# Patient Record
Sex: Female | Born: 1950 | Race: White | Hispanic: No | Marital: Married | Smoking: Never smoker
Health system: Southern US, Community
[De-identification: ages and names within clinical notes are randomized; demographics above are authoritative.]

## PROBLEM LIST (undated history)

## (undated) DIAGNOSIS — T7840XA Allergy, unspecified, initial encounter: Secondary | ICD-10-CM

## (undated) DIAGNOSIS — E079 Disorder of thyroid, unspecified: Secondary | ICD-10-CM

## (undated) DIAGNOSIS — IMO0002 Reserved for concepts with insufficient information to code with codable children: Secondary | ICD-10-CM

## (undated) DIAGNOSIS — M8580 Other specified disorders of bone density and structure, unspecified site: Secondary | ICD-10-CM

## (undated) DIAGNOSIS — D229 Melanocytic nevi, unspecified: Secondary | ICD-10-CM

## (undated) DIAGNOSIS — L409 Psoriasis, unspecified: Secondary | ICD-10-CM

## (undated) DIAGNOSIS — M1990 Unspecified osteoarthritis, unspecified site: Secondary | ICD-10-CM

## (undated) HISTORY — PX: SKIN SURGERY: SHX2413

## (undated) HISTORY — DX: Other specified disorders of bone density and structure, unspecified site: M85.80

## (undated) HISTORY — DX: Psoriasis, unspecified: L40.9

## (undated) HISTORY — DX: Allergy, unspecified, initial encounter: T78.40XA

## (undated) HISTORY — DX: Disorder of thyroid, unspecified: E07.9

## (undated) HISTORY — DX: Unspecified osteoarthritis, unspecified site: M19.90

## (undated) HISTORY — DX: Reserved for concepts with insufficient information to code with codable children: IMO0002

## (undated) HISTORY — DX: Melanocytic nevi, unspecified: D22.9

---
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
## 1975-07-08 HISTORY — PX: OTHER SURGICAL HISTORY: SHX169

## 1985-07-07 HISTORY — PX: OTHER SURGICAL HISTORY: SHX169

## 2000-05-14 ENCOUNTER — Other Ambulatory Visit: Admission: RE | Admit: 2000-05-14 | Discharge: 2000-05-14 | Payer: Self-pay | Admitting: Obstetrics and Gynecology

## 2000-06-05 ENCOUNTER — Ambulatory Visit (HOSPITAL_COMMUNITY): Admission: RE | Admit: 2000-06-05 | Discharge: 2000-06-05 | Payer: Self-pay | Admitting: Obstetrics and Gynecology

## 2000-06-05 ENCOUNTER — Encounter: Payer: Self-pay | Admitting: Obstetrics and Gynecology

## 2000-07-07 HISTORY — PX: LUNG LOBECTOMY: SHX167

## 2001-06-21 ENCOUNTER — Encounter: Payer: Self-pay | Admitting: Thoracic Surgery

## 2001-06-21 ENCOUNTER — Inpatient Hospital Stay (HOSPITAL_COMMUNITY): Admission: RE | Admit: 2001-06-21 | Discharge: 2001-06-24 | Payer: Self-pay | Admitting: Thoracic Surgery

## 2001-06-22 ENCOUNTER — Encounter: Payer: Self-pay | Admitting: Thoracic Surgery

## 2001-06-23 ENCOUNTER — Encounter: Payer: Self-pay | Admitting: Thoracic Surgery

## 2001-06-24 ENCOUNTER — Encounter: Payer: Self-pay | Admitting: Thoracic Surgery

## 2001-06-29 ENCOUNTER — Encounter: Admission: RE | Admit: 2001-06-29 | Discharge: 2001-06-29 | Payer: Self-pay | Admitting: Thoracic Surgery

## 2001-06-29 ENCOUNTER — Encounter: Payer: Self-pay | Admitting: Thoracic Surgery

## 2001-07-28 ENCOUNTER — Encounter: Admission: RE | Admit: 2001-07-28 | Discharge: 2001-07-28 | Payer: Self-pay | Admitting: Thoracic Surgery

## 2001-07-28 ENCOUNTER — Encounter: Payer: Self-pay | Admitting: Thoracic Surgery

## 2001-08-24 ENCOUNTER — Other Ambulatory Visit: Admission: RE | Admit: 2001-08-24 | Discharge: 2001-08-24 | Payer: Self-pay | Admitting: Obstetrics and Gynecology

## 2001-09-29 ENCOUNTER — Encounter: Payer: Self-pay | Admitting: Thoracic Surgery

## 2001-09-29 ENCOUNTER — Encounter: Admission: RE | Admit: 2001-09-29 | Discharge: 2001-09-29 | Payer: Self-pay | Admitting: Thoracic Surgery

## 2002-05-24 ENCOUNTER — Ambulatory Visit (HOSPITAL_COMMUNITY): Admission: RE | Admit: 2002-05-24 | Discharge: 2002-05-24 | Payer: Self-pay | Admitting: Obstetrics and Gynecology

## 2002-05-24 ENCOUNTER — Encounter: Payer: Self-pay | Admitting: Obstetrics and Gynecology

## 2002-07-07 DIAGNOSIS — E079 Disorder of thyroid, unspecified: Secondary | ICD-10-CM

## 2002-07-07 HISTORY — DX: Disorder of thyroid, unspecified: E07.9

## 2002-08-25 ENCOUNTER — Other Ambulatory Visit: Admission: RE | Admit: 2002-08-25 | Discharge: 2002-08-25 | Payer: Self-pay | Admitting: Obstetrics and Gynecology

## 2002-08-26 ENCOUNTER — Encounter: Payer: Self-pay | Admitting: Obstetrics and Gynecology

## 2002-08-26 ENCOUNTER — Encounter: Admission: RE | Admit: 2002-08-26 | Discharge: 2002-08-26 | Payer: Self-pay | Admitting: Obstetrics and Gynecology

## 2005-12-30 ENCOUNTER — Ambulatory Visit: Payer: Self-pay | Admitting: Internal Medicine

## 2006-02-04 ENCOUNTER — Ambulatory Visit: Payer: Self-pay | Admitting: Internal Medicine

## 2006-03-27 ENCOUNTER — Ambulatory Visit (HOSPITAL_COMMUNITY): Admission: RE | Admit: 2006-03-27 | Discharge: 2006-03-27 | Payer: Self-pay | Admitting: Obstetrics and Gynecology

## 2009-07-07 DIAGNOSIS — M8580 Other specified disorders of bone density and structure, unspecified site: Secondary | ICD-10-CM

## 2009-07-07 HISTORY — DX: Other specified disorders of bone density and structure, unspecified site: M85.80

## 2010-07-27 ENCOUNTER — Encounter: Payer: Self-pay | Admitting: Internal Medicine

## 2011-08-05 ENCOUNTER — Other Ambulatory Visit: Payer: Self-pay | Admitting: Physician Assistant

## 2011-08-05 MED ORDER — LEVOTHYROXINE SODIUM 50 MCG PO TABS: 50.0000 ug | ORAL_TABLET | Freq: Every day | ORAL | Status: DC

## 2011-11-06 ENCOUNTER — Ambulatory Visit

## 2012-03-04 ENCOUNTER — Ambulatory Visit

## 2012-03-04 ENCOUNTER — Ambulatory Visit (INDEPENDENT_AMBULATORY_CARE_PROVIDER_SITE_OTHER): Admitting: Internal Medicine

## 2012-03-04 VITALS — BP 112/70 | HR 72 | Temp 97.7°F | Resp 16 | Ht 62.5 in | Wt 121.0 lb

## 2012-03-04 DIAGNOSIS — M542 Cervicalgia: Secondary | ICD-10-CM

## 2012-03-04 DIAGNOSIS — R202 Paresthesia of skin: Secondary | ICD-10-CM

## 2012-03-04 DIAGNOSIS — R209 Unspecified disturbances of skin sensation: Secondary | ICD-10-CM

## 2012-03-04 MED ORDER — HYDROCODONE-ACETAMINOPHEN 5-500 MG PO TABS: 1.0000 | ORAL_TABLET | Freq: Three times a day (TID) | ORAL | Status: AC | PRN

## 2012-03-04 MED ORDER — METHOCARBAMOL 750 MG PO TABS: 750.0000 mg | ORAL_TABLET | Freq: Four times a day (QID) | ORAL | Status: AC

## 2012-03-04 MED ORDER — METHYLPREDNISOLONE ACETATE 80 MG/ML IJ SUSP
80.0000 mg | Freq: Once | INTRAMUSCULAR | Status: AC
  Administered 2012-03-04: 80 mg via INTRAMUSCULAR

## 2012-03-04 NOTE — Progress Notes (Signed)
  Subjective:    Patient ID: Madison Yang, female    DOB: [birth year], 61 y.o.   MRN: [id number]  HPI Has recurrent neck pain on right side, now radiating into right arm and hand. Occ numbness, no weakness noted. No recent trauma, xr 3 yrs ago.   Review of Systems healthy    Objective:   Physical Exam  Constitutional: She is oriented to person, place, and time. She appears well-developed and well-nourished.  HENT:  Right Ear: External ear normal.  Left Ear: External ear normal.  Nose: Nose normal.  Eyes: EOM are normal. Pupils are equal, round, and reactive to light.  Cardiovascular: Normal rate.   Pulmonary/Chest: Effort normal.  Neurological: She is alert and oriented to person, place, and time.  Skin: Skin is warm and dry.  Psychiatric: She has a normal mood and affect.      [facility] reading (PRIMARY) by  Dr.Guest. cspine ddd,spondylosis      Assessment & Plan:  Robaxin/vicodin Neck care manual

## 2012-03-05 ENCOUNTER — Encounter: Payer: Self-pay | Admitting: Family Medicine

## 2012-03-18 ENCOUNTER — Encounter: Admitting: Physician Assistant

## 2012-05-18 ENCOUNTER — Encounter: Admitting: Family Medicine

## 2012-07-21 ENCOUNTER — Ambulatory Visit (INDEPENDENT_AMBULATORY_CARE_PROVIDER_SITE_OTHER): Admitting: Family Medicine

## 2012-07-21 ENCOUNTER — Other Ambulatory Visit: Payer: Self-pay | Admitting: Family Medicine

## 2012-07-21 ENCOUNTER — Encounter: Payer: Self-pay | Admitting: Family Medicine

## 2012-07-21 VITALS — BP 128/87 | HR 74 | Temp 98.0°F | Resp 16 | Ht 63.0 in | Wt 120.0 lb

## 2012-07-21 DIAGNOSIS — Z01419 Encounter for gynecological examination (general) (routine) without abnormal findings: Secondary | ICD-10-CM

## 2012-07-21 DIAGNOSIS — Z0000 Encounter for general adult medical examination without abnormal findings: Secondary | ICD-10-CM

## 2012-07-21 DIAGNOSIS — Z1211 Encounter for screening for malignant neoplasm of colon: Secondary | ICD-10-CM

## 2012-07-21 LAB — POCT UA - MICROSCOPIC ONLY: Crystals, Ur, HPF, POC: NEGATIVE

## 2012-07-21 LAB — COMPREHENSIVE METABOLIC PANEL
ALT: 21 U/L (ref 0–35)
AST: 22 U/L (ref 0–37)
Albumin: 5 g/dL (ref 3.5–5.2)
Alkaline Phosphatase: 43 U/L (ref 39–117)
Potassium: 3.8 mEq/L (ref 3.5–5.3)
Sodium: 137 mEq/L (ref 135–145)
Total Bilirubin: 0.6 mg/dL (ref 0.3–1.2)
Total Protein: 6.6 g/dL (ref 6.0–8.3)

## 2012-07-21 LAB — CBC WITH DIFFERENTIAL/PLATELET
Basophils Absolute: 0.1 10*3/uL (ref 0.0–0.1)
Basophils Relative: 2 % — ABNORMAL HIGH (ref 0–1)
Eosinophils Absolute: 0.1 10*3/uL (ref 0.0–0.7)
Hemoglobin: 15.3 g/dL — ABNORMAL HIGH (ref 12.0–15.0)
MCH: 31.1 pg (ref 26.0–34.0)
MCHC: 35.2 g/dL (ref 30.0–36.0)
Monocytes Relative: 8 % (ref 3–12)
Neutro Abs: 1.9 10*3/uL (ref 1.7–7.7)
Neutrophils Relative %: 64 % (ref 43–77)
Platelets: 290 10*3/uL (ref 150–400)
RBC: 4.92 MIL/uL (ref 3.87–5.11)

## 2012-07-21 LAB — POCT URINALYSIS DIPSTICK
Bilirubin, UA: NEGATIVE
Glucose, UA: NEGATIVE
Ketones, UA: NEGATIVE
Leukocytes, UA: NEGATIVE
Protein, UA: NEGATIVE
Spec Grav, UA: 1.01

## 2012-07-21 LAB — LIPID PANEL
HDL: 92 mg/dL (ref >39)
LDL Cholesterol: 167 mg/dL — ABNORMAL HIGH (ref 0–99)
Total CHOL/HDL Ratio: 3 Ratio
VLDL: 15 mg/dL (ref 0–40)

## 2012-07-21 LAB — VITAMIN B12: Vitamin B-12: 834 pg/mL (ref 211–911)

## 2012-07-21 LAB — TSH: TSH: 2.096 u[IU]/mL (ref 0.350–4.500)

## 2012-07-21 NOTE — Progress Notes (Signed)
[address]   [place], [place]  [address]   [phone]  Subjective:    Patient ID: Madison Yang, female    DOB: 17-Jul-1950, 62 y.o.   MRN: [id number]  HPIThis 62 y.o. female presents for evaluation for CPE.   Last physical 12/2010. Pap smear 12/2010 WNL; no HPV.  Pap smear 07/2008 WNL; no HPV obtained. Mammogram 05/2012 at [facility].   Colonoscopy 2004; [facility].   TDAP 07/2008. Zostavax 05/2012. Flu vaccine 2013.   Eye exam 2013; +glasses; no glaucoma or cataracts. Dental exam temporary crown 2014. Bone density scan unknown.    Other providers:  [facility]/GI; Endocrin[facility]; [facility]/Dermatology; Gyn/none.     Review of Systems  Constitutional: Negative for fever, chills, diaphoresis, activity change, appetite change, fatigue and unexpected weight change.  HENT: Negative for hearing loss, ear pain, nosebleeds, congestion, sore throat, facial swelling, rhinorrhea, sneezing, drooling, mouth sores, trouble swallowing, neck pain, neck stiffness, dental problem, voice change, postnasal drip, sinus pressure, tinnitus and ear discharge.   Eyes: Negative for photophobia, pain, discharge, redness, itching and visual disturbance.  Respiratory: Negative for apnea, cough, choking, chest tightness, shortness of breath, wheezing and stridor.   Cardiovascular: Negative for chest pain, palpitations and leg swelling.  Gastrointestinal: Negative for nausea, vomiting, abdominal pain, diarrhea, constipation, blood in stool, abdominal distention, anal bleeding and rectal pain.  Genitourinary: Negative for dysuria, urgency, frequency, hematuria, flank pain, decreased urine volume, vaginal bleeding, vaginal discharge, enuresis, difficulty urinating, genital sores, vaginal pain, menstrual problem, pelvic pain and dyspareunia.  Musculoskeletal: Negative for myalgias, back pain, joint swelling, arthralgias and gait problem.  Skin: Negative for color change, pallor, rash and wound.  Neurological: Negative for  dizziness, tremors, seizures, syncope, facial asymmetry, speech difficulty, weakness, light-headedness, numbness and headaches.  Hematological: Negative for adenopathy. Does not bruise/bleed easily.  Psychiatric/Behavioral: Positive for sleep disturbance. Negative for suicidal ideas, hallucinations, behavioral problems, confusion, self-injury, dysphoric mood, decreased concentration and agitation. The patient is not nervous/anxious and is not hyperactive.     Past Medical History  Diagnosis Date  . Allergy   . Anemia   . Thyroid disease 07/07/2002    Hypothyroidism  . Arthritis     Cervical DDD C4, C7; L foot    Past Surgical History  Procedure Date  . Ovarian cyst removed 1977  . Ovarian scar tissue removal 1987  . Tonsilectomy, adenoidectomy, bilateral myringotomy and tubes 1959  . Lung lobectomy   . Colonoscopy 09/05/2002    normal; [facility]    Prior to Admission medications   Medication Sig Start Date End Date Taking? Authorizing Provider  ascorbic acid (VITAMIN C) 500 MG tablet Take 500 mg by mouth daily.   Yes Historical Provider, MD  calcium citrate-vitamin D 200-200 MG-UNIT TABS Take 1 tablet by mouth daily.   Yes Historical Provider, MD  Emollient (ROC RETINOL CORREXION NIGHT) CREA Apply 1 application topically at bedtime.   Yes Historical Provider, MD  levothyroxine (SYNTHROID, LEVOTHROID) 50 MCG tablet Take 1 tablet (50 mcg total) by mouth daily. 08/05/11 08/04/12 Yes Chelle S Jeffery, PA-C  vitamin E 400 UNIT capsule Take 400 Units by mouth daily.   Yes Historical Provider, MD  Wheat Dextrin (BENEFIBER PO) Take by mouth.   Yes Historical Provider, MD    Allergies  Allergen Reactions  . Penicillins Other (See Comments)    child    History   Social History  . Marital Status: Single    Spouse Name: N/A    Number of Children: N/A  .  Years of Education: N/A   Occupational History  . Not on file.   Social History Main Topics  . Smoking status: Never Smoker   .  Smokeless tobacco: Never Used  . Alcohol Use: 8.4 oz/week    14 Glasses of wine per week  . Drug Use: No  . Sexually Active: Yes    Birth Control/ Protection: None   Other Topics Concern  . Not on file   Social History Narrative   Marital status: married x 20 years; happily married; no abuse; widowed 1988.   Children: stepchildren 3; 6 grandchildren; 1 great grandchildren.   Lives: with husband.   Employment:  Unemployed.   Tobacco: none    Alcohol:  1-2 glasses of wine five days per week.    Drugs: none   Exercise:  Walking 2-3 days per week; swimming.    Family History  Problem Relation Age of Onset  . Hypertension Mother   . Hyperlipidemia Mother   . Dementia Mother   . Hyperlipidemia Father   . Hypertension Father        Objective:   Physical Exam  Nursing note and vitals reviewed. Constitutional: She is oriented to person, place, and time. She appears well-developed and well-nourished. No distress.  HENT:  Head: Normocephalic and atraumatic.  Right Ear: External ear normal.  Left Ear: External ear normal.  Nose: Nose normal.  Mouth/Throat: Oropharynx is clear and moist.  Eyes: Conjunctivae normal and EOM are normal. Pupils are equal, round, and reactive to light.  Neck: Normal range of motion. Neck supple. No JVD present. No thyromegaly present.  Cardiovascular: Normal rate, regular rhythm, normal heart sounds and intact distal pulses.  Exam reveals no gallop and no friction rub.   No murmur heard. Pulmonary/Chest: Effort normal and breath sounds normal. She has no wheezes. She has no rales.  Abdominal: Soft. Bowel sounds are normal. There is no tenderness. There is no rebound and no guarding. Hernia confirmed negative in the right inguinal area and confirmed negative in the left inguinal area.  Genitourinary: Vagina normal and uterus normal. No breast swelling, tenderness, discharge or bleeding. There is no rash, tenderness or lesion on the right labia. There is no rash,  tenderness or lesion on the left labia. No vaginal discharge found.  Musculoskeletal:       Right shoulder: Normal.       Left shoulder: Normal.       Cervical back: Normal.  Lymphadenopathy:    She has no cervical adenopathy.       Right: No inguinal adenopathy present.       Left: No inguinal adenopathy present.  Neurological: She is alert and oriented to person, place, and time. She has normal reflexes. No cranial nerve deficit. She exhibits normal muscle tone. Coordination normal.  Skin: Skin is warm and dry. No rash noted. She is not diaphoretic.       Diffuse sun related changes throughout facial.  Psychiatric: She has a normal mood and affect. Her behavior is normal. Judgment and thought content normal.   EKG:        Assessment & Plan:   1. Routine general medical examination at a health care facility  Pap IG and HPV (high risk) DNA detection  2. Routine gynecological examination  CBC with Differential, Comprehensive metabolic panel, Hemoglobin A1c, Lipid panel, TSH, Vitamin B12, Vitamin D 25 hydroxy, EKG 12-Lead, POCT UA - Microscopic Only  3. Colon cancer screening  Ambulatory referral to Gastroenterology  1.  CPE:  Anticipatory guidance provided -- exercise, Calcium supplementation daily.  Pap smear obtained; mammogram UTD.  Refer for repeat colonoscopy. Immunizations UTD.  Obtain labs. 2.  Gynecological exam:  Pap smear with HPV obtained; mammogram UTD.  Completed in office. 3.  Colon cancer screening: refer to Dr. Juanda Chance for repeat colonoscopy.

## 2012-07-21 NOTE — Patient Instructions (Addendum)
1. Routine general medical examination at a health care facility  Pap IG and HPV (high risk) DNA detection  2. Routine gynecological examination  CBC with Differential, Comprehensive metabolic panel, Hemoglobin A1c, Lipid panel, TSH, Vitamin B12, Vitamin D 25 hydroxy, EKG 12-Lead, POCT UA - Microscopic Only  3. Colon cancer screening  Ambulatory referral to Gastroenterology   Health Maintenance, Females A healthy lifestyle and preventative care can promote health and wellness.  Maintain regular health, dental, and eye exams.  Eat a healthy diet. Foods like vegetables, fruits, whole grains, low-fat dairy products, and lean protein foods contain the nutrients you need without too many calories. Decrease your intake of foods high in solid fats, added sugars, and salt. Get information about a proper diet from your caregiver, if necessary.  Regular physical exercise is one of the most important things you can do for your health. Most adults should get at least 150 minutes of moderate-intensity exercise (any activity that increases your heart rate and causes you to sweat) each week. In addition, most adults need muscle-strengthening exercises on 2 or more days a week.   Maintain a healthy weight. The body mass index (BMI) is a screening tool to identify possible weight problems. It provides an estimate of body fat based on height and weight. Your caregiver can help determine your BMI, and can help you achieve or maintain a healthy weight. For adults 20 years and older:  A BMI below 18.5 is considered underweight.  A BMI of 18.5 to 24.9 is normal.  A BMI of 25 to 29.9 is considered overweight.  A BMI of 30 and above is considered obese.  Maintain normal blood lipids and cholesterol by exercising and minimizing your intake of saturated fat. Eat a balanced diet with plenty of fruits and vegetables. Blood tests for lipids and cholesterol should begin at age 86 and be repeated every 5 years. If your  lipid or cholesterol levels are high, you are over 50, or you are a high risk for heart disease, you may need your cholesterol levels checked more frequently.Ongoing high lipid and cholesterol levels should be treated with medicines if diet and exercise are not effective.  If you smoke, find out from your caregiver how to quit. If you do not use tobacco, do not start.  If you are pregnant, do not drink alcohol. If you are breastfeeding, be very cautious about drinking alcohol. If you are not pregnant and choose to drink alcohol, do not exceed 1 drink per day. One drink is considered to be 12 ounces (355 mL) of beer, 5 ounces (148 mL) of wine, or 1.5 ounces (44 mL) of liquor.  Avoid use of street drugs. Do not share needles with anyone. Ask for help if you need support or instructions about stopping the use of drugs.  High blood pressure causes heart disease and increases the risk of stroke. Blood pressure should be checked at least every 1 to 2 years. Ongoing high blood pressure should be treated with medicines, if weight loss and exercise are not effective.  If you are 53 to 61 years old, ask your caregiver if you should take aspirin to prevent strokes.  Diabetes screening involves taking a blood sample to check your fasting blood sugar level. This should be done once every 3 years, after age 63, if you are within normal weight and without risk factors for diabetes. Testing should be considered at a younger age or be carried out more frequently if you are overweight  and have at least 1 risk factor for diabetes.  Breast cancer screening is essential preventative care for women. You should practice "breast self-awareness." This means understanding the normal appearance and feel of your breasts and may include breast self-examination. Any changes detected, no matter how small, should be reported to a caregiver. Women in their 22s and 30s should have a clinical breast exam (CBE) by a caregiver as part of  a regular health exam every 1 to 3 years. After age 58, women should have a CBE every year. Starting at age 58, women should consider having a mammogram (breast X-ray) every year. Women who have a family history of breast cancer should talk to their caregiver about genetic screening. Women at a high risk of breast cancer should talk to their caregiver about having an MRI and a mammogram every year.  The Pap test is a screening test for cervical cancer. Women should have a Pap test starting at age 52. Between ages 42 and 45, Pap tests should be repeated every 2 years. Beginning at age 2, you should have a Pap test every 3 years as long as the past 3 Pap tests have been normal. If you had a hysterectomy for a problem that was not cancer or a condition that could lead to cancer, then you no longer need Pap tests. If you are between ages 59 and 19, and you have had normal Pap tests going back 10 years, you no longer need Pap tests. If you have had past treatment for cervical cancer or a condition that could lead to cancer, you need Pap tests and screening for cancer for at least 20 years after your treatment. If Pap tests have been discontinued, risk factors (such as a new sexual partner) need to be reassessed to determine if screening should be resumed. Some women have medical problems that increase the chance of getting cervical cancer. In these cases, your caregiver may recommend more frequent screening and Pap tests.  The human papillomavirus (HPV) test is an additional test that may be used for cervical cancer screening. The HPV test looks for the virus that can cause the cell changes on the cervix. The cells collected during the Pap test can be tested for HPV. The HPV test could be used to screen women aged 51 years and older, and should be used in women of any age who have unclear Pap test results. After the age of 63, women should have HPV testing at the same frequency as a Pap test.  Colorectal cancer  can be detected and often prevented. Most routine colorectal cancer screening begins at the age of 25 and continues through age 78. However, your caregiver may recommend screening at an earlier age if you have risk factors for colon cancer. On a yearly basis, your caregiver may provide home test kits to check for hidden blood in the stool. Use of a small camera at the end of a tube, to directly examine the colon (sigmoidoscopy or colonoscopy), can detect the earliest forms of colorectal cancer. Talk to your caregiver about this at age 67, when routine screening begins. Direct examination of the colon should be repeated every 5 to 10 years through age 52, unless early forms of pre-cancerous polyps or small growths are found.  Hepatitis C blood testing is recommended for all people born from [birth year] through 1964 and any individual with known risks for hepatitis C.  Practice safe sex. Use condoms and avoid high-risk sexual practices to reduce the  spread of sexually transmitted infections (STIs). Sexually active women aged 63 and younger should be checked for Chlamydia, which is a common sexually transmitted infection. Older women with new or multiple partners should also be tested for Chlamydia. Testing for other STIs is recommended if you are sexually active and at increased risk.  Osteoporosis is a disease in which the bones lose minerals and strength with aging. This can result in serious bone fractures. The risk of osteoporosis can be identified using a bone density scan. Women ages 14 and over and women at risk for fractures or osteoporosis should discuss screening with their caregivers. Ask your caregiver whether you should be taking a calcium supplement or vitamin D to reduce the rate of osteoporosis.  Menopause can be associated with physical symptoms and risks. Hormone replacement therapy is available to decrease symptoms and risks. You should talk to your caregiver about whether hormone replacement  therapy is right for you.  Use sunscreen with a sun protection factor (SPF) of 30 or greater. Apply sunscreen liberally and repeatedly throughout the day. You should seek shade when your shadow is shorter than you. Protect yourself by wearing long sleeves, pants, a wide-brimmed hat, and sunglasses year round, whenever you are outdoors.  Notify your caregiver of new moles or changes in moles, especially if there is a change in shape or color. Also notify your caregiver if a mole is larger than the size of a pencil eraser.  Stay current with your immunizations. Document Released: 01/06/2011 Document Revised: 09/15/2011 Document Reviewed: 01/06/2011 [facility] Patient Information 2013 [place], [place].

## 2012-07-22 ENCOUNTER — Encounter: Payer: Self-pay | Admitting: Internal Medicine

## 2012-07-22 ENCOUNTER — Encounter: Payer: Self-pay | Admitting: Family Medicine

## 2012-07-22 LAB — HEMOGLOBIN A1C: Mean Plasma Glucose: 103 mg/dL (ref <117)

## 2012-07-25 LAB — PAP IG AND HPV HIGH-RISK: HPV DNA High Risk: NOT DETECTED

## 2012-08-03 ENCOUNTER — Encounter: Payer: Self-pay | Admitting: Internal Medicine

## 2012-08-13 ENCOUNTER — Ambulatory Visit (AMBULATORY_SURGERY_CENTER): Admitting: *Deleted

## 2012-08-13 VITALS — Ht 63.0 in | Wt 120.0 lb

## 2012-08-13 DIAGNOSIS — Z1211 Encounter for screening for malignant neoplasm of colon: Secondary | ICD-10-CM

## 2012-08-13 MED ORDER — MOVIPREP 100 G PO SOLR: ORAL | Status: DC

## 2012-08-27 ENCOUNTER — Encounter: Payer: Self-pay | Admitting: Internal Medicine

## 2012-09-04 ENCOUNTER — Encounter: Payer: Self-pay | Admitting: *Deleted

## 2012-09-04 HISTORY — PX: COLONOSCOPY: SHX174

## 2012-09-10 ENCOUNTER — Encounter: Payer: Self-pay | Admitting: Internal Medicine

## 2012-09-28 ENCOUNTER — Encounter: Payer: Self-pay | Admitting: Internal Medicine

## 2012-09-28 ENCOUNTER — Ambulatory Visit (AMBULATORY_SURGERY_CENTER): Admitting: Internal Medicine

## 2012-09-28 VITALS — BP 134/73 | HR 58 | Temp 96.3°F | Resp 13 | Ht 63.0 in | Wt 120.0 lb

## 2012-09-28 DIAGNOSIS — Z1211 Encounter for screening for malignant neoplasm of colon: Secondary | ICD-10-CM

## 2012-09-28 MED ORDER — SODIUM CHLORIDE 0.9 % IV SOLN: 500.0000 mL | INTRAVENOUS | Status: DC

## 2012-09-28 NOTE — Op Note (Signed)
[facility] [address] [place], [address]   COLONOSCOPY PROCEDURE REPORT  PATIENT: Madison, Yang  MR#: [id number] BIRTHDATE: 1950/11/05 , 61  yrs. old GENDER: Female ENDOSCOPIST: Hart Carwin, MD REFERRED BY:  Nilda Simmer, M.D. PROCEDURE DATE:  09/28/2012 PROCEDURE:   Colonoscopy, screening ASA CLASS:   Class I INDICATIONS:Average risk patient for colon cancer and colonoscopy 2004 was normal. MEDICATIONS: MAC sedation, administered by CRNA and propofol (Diprivan) 250mg  IV  DESCRIPTION OF PROCEDURE:   After the risks and benefits and of the procedure were explained, informed consent was obtained.  A digital rectal exam revealed no abnormalities of the rectum.    The LB [id number]  endoscope was introduced through the anus and advanced to the cecum, which was identified by both the appendix and ileocecal valve .  The quality of the prep was excellent, using MoviPrep .  The instrument was then slowly withdrawn as the colon was fully examined.     COLON FINDINGS: A normal appearing cecum, ileocecal valve, and appendiceal orifice were identified.  The ascending, hepatic flexure, transverse, splenic flexure, descending, sigmoid colon and rectum appeared unremarkable.  No polyps or cancers were seen. Small internal hemorrhoids were found.     Retroflexed views revealed no abnormalities.     The scope was then withdrawn from the patient and the procedure completed.  COMPLICATIONS: There were no complications. ENDOSCOPIC IMPRESSION: 1.   Normal colon 2.   Small internal hemorrhoids  RECOMMENDATIONS: High fiber diet   REPEAT EXAM: In 10 year(s)  for Colonoscopy.  cc:  _______________________________ eSignedHart Carwin, MD 09/28/2012 9:40 AM

## 2012-09-28 NOTE — Progress Notes (Signed)
Patient did not experience any of the following events: a burn prior to discharge; a fall within the facility; wrong site/side/patient/procedure/implant event; or a hospital transfer or hospital admission upon discharge from the facility. (G8907) Patient did not have preoperative order for IV antibiotic SSI prophylaxis. (G8918)  

## 2012-09-28 NOTE — Patient Instructions (Addendum)

## 2012-09-29 ENCOUNTER — Telehealth: Payer: Self-pay

## 2012-09-29 NOTE — Telephone Encounter (Signed)
  Follow up Call-  Call back number 09/28/2012  Post procedure Call Back phone  # [phone]  Permission to leave phone message Yes     Patient questions:  Do you have a fever, pain , or abdominal swelling? no Pain Score  0 *  Have you tolerated food without any problems? yes  Have you been able to return to your normal activities? yes  Do you have any questions about your discharge instructions: Diet   no Medications  no Follow up visit  no  Do you have questions or concerns about your Care? no  Actions: * If pain score is 4 or above: No action needed, pain <4.

## 2013-04-26 ENCOUNTER — Ambulatory Visit (INDEPENDENT_AMBULATORY_CARE_PROVIDER_SITE_OTHER): Admitting: *Deleted

## 2013-04-26 DIAGNOSIS — Z23 Encounter for immunization: Secondary | ICD-10-CM

## 2013-04-26 NOTE — Progress Notes (Signed)
  Subjective:    Patient ID: Madison Yang, female    DOB: 1951-06-27, 62 y.o.   MRN: [id number]  HPI    Review of Systems     Objective:   Physical Exam        Assessment & Plan:  Patient here for flu vaccine only.

## 2014-02-01 ENCOUNTER — Encounter: Payer: Self-pay | Admitting: Family Medicine

## 2014-02-01 ENCOUNTER — Ambulatory Visit (INDEPENDENT_AMBULATORY_CARE_PROVIDER_SITE_OTHER): Admitting: Family Medicine

## 2014-02-01 VITALS — BP 112/70 | HR 61 | Temp 97.8°F | Resp 16 | Ht 62.5 in | Wt 118.0 lb

## 2014-02-01 DIAGNOSIS — M949 Disorder of cartilage, unspecified: Secondary | ICD-10-CM

## 2014-02-01 DIAGNOSIS — M899 Disorder of bone, unspecified: Secondary | ICD-10-CM

## 2014-02-01 DIAGNOSIS — Z01419 Encounter for gynecological examination (general) (routine) without abnormal findings: Secondary | ICD-10-CM

## 2014-02-01 DIAGNOSIS — E039 Hypothyroidism, unspecified: Secondary | ICD-10-CM

## 2014-02-01 DIAGNOSIS — E7800 Pure hypercholesterolemia, unspecified: Secondary | ICD-10-CM

## 2014-02-01 DIAGNOSIS — Z0000 Encounter for general adult medical examination without abnormal findings: Secondary | ICD-10-CM

## 2014-02-01 DIAGNOSIS — M8580 Other specified disorders of bone density and structure, unspecified site: Secondary | ICD-10-CM

## 2014-02-01 DIAGNOSIS — Z131 Encounter for screening for diabetes mellitus: Secondary | ICD-10-CM

## 2014-02-01 LAB — CBC
HEMATOCRIT: 44 % (ref 36.0–46.0)
Hemoglobin: 15.5 g/dL — ABNORMAL HIGH (ref 12.0–15.0)
MCH: 32.1 pg (ref 26.0–34.0)
MCHC: 35.2 g/dL (ref 30.0–36.0)
MCV: 91.1 fL (ref 78.0–100.0)
Platelets: 289 10*3/uL (ref 150–400)
RBC: 4.83 MIL/uL (ref 3.87–5.11)
RDW: 13.3 % (ref 11.5–15.5)
WBC: 3.2 10*3/uL — AB (ref 4.0–10.5)

## 2014-02-01 LAB — POCT URINALYSIS DIPSTICK
Bilirubin, UA: NEGATIVE
Blood, UA: NEGATIVE
Glucose, UA: NEGATIVE
KETONES UA: NEGATIVE
LEUKOCYTES UA: NEGATIVE
Nitrite, UA: NEGATIVE
PH UA: 7
Protein, UA: NEGATIVE
SPEC GRAV UA: 1.01
Urobilinogen, UA: 0.2

## 2014-02-01 LAB — COMPREHENSIVE METABOLIC PANEL
ALBUMIN: 5.1 g/dL (ref 3.5–5.2)
ALT: 17 U/L (ref 0–35)
AST: 21 U/L (ref 0–37)
Alkaline Phosphatase: 50 U/L (ref 39–117)
BUN: 12 mg/dL (ref 6–23)
CALCIUM: 9.7 mg/dL (ref 8.4–10.5)
CHLORIDE: 99 meq/L (ref 96–112)
CO2: 27 mEq/L (ref 19–32)
Creat: 0.75 mg/dL (ref 0.50–1.10)
Glucose, Bld: 77 mg/dL (ref 70–99)
POTASSIUM: 4.1 meq/L (ref 3.5–5.3)
Sodium: 136 mEq/L (ref 135–145)
Total Bilirubin: 0.6 mg/dL (ref 0.2–1.2)
Total Protein: 7.4 g/dL (ref 6.0–8.3)

## 2014-02-01 LAB — LIPID PANEL
CHOLESTEROL: 269 mg/dL — AB (ref 0–200)
HDL: 109 mg/dL (ref >39)
LDL Cholesterol: 149 mg/dL — ABNORMAL HIGH (ref 0–99)
TRIGLYCERIDES: 53 mg/dL (ref <150)
Total CHOL/HDL Ratio: 2.5 Ratio
VLDL: 11 mg/dL (ref 0–40)

## 2014-02-01 NOTE — Progress Notes (Signed)
Subjective:    Patient ID: Madison Yang, female    DOB: 1951/05/25, 63 y.o.   MRN: [id number]  02/01/2014  Annual Exam   HPI This 63 y.o. female presents for Complete Physical Examination.  Last physical: 07/2012. Pap smear:  07/21/12 WNL HPV negative. Mammogram: fall 2014 [facility]. Bone density:  Unsure of date.  Four years ago?  Osteopenia.   Colonoscopy: 09/28/12 WNL; [facility].  Repeat in 10 years. TDAP: 2010 Pneumovax:  never Zostavax:  05/07/2012. Influenza: 04/26/13 Eye exam:  +glasses; 2014; no gluacoma or cataracts. Dental exam:   Appointment next month; every six months.  Hypothyroidism: evaluated by Dr. Forde Dandy last week; requesting TSH and Vitamin D be checked. Patient reports good compliance with medication, good tolerance to medication, and good symptom control.     Osteopenia:  Doing better on calcium.  Last bone density scan four years ago approximately.  Has found a good calcium supplement.    Review of Systems  Constitutional: Negative for fever, chills, diaphoresis, activity change, appetite change, fatigue and unexpected weight change.  HENT: Negative for congestion, dental problem, drooling, ear discharge, ear pain, facial swelling, hearing loss, mouth sores, nosebleeds, postnasal drip, rhinorrhea, sinus pressure, sneezing, sore throat, tinnitus, trouble swallowing and voice change.   Eyes: Negative for photophobia, pain, discharge, redness, itching and visual disturbance.  Respiratory: Negative for apnea, cough, choking, chest tightness, shortness of breath, wheezing and stridor.   Cardiovascular: Negative for chest pain, palpitations and leg swelling.  Gastrointestinal: Negative for nausea, vomiting, abdominal pain, diarrhea, constipation, blood in stool, abdominal distention, anal bleeding and rectal pain.  Endocrine: Negative for cold intolerance, heat intolerance, polydipsia, polyphagia and polyuria.  Genitourinary: Positive for dyspareunia. Negative for dysuria,  urgency, frequency, hematuria, flank pain, decreased urine volume, vaginal bleeding, vaginal discharge, enuresis, difficulty urinating, genital sores, vaginal pain, menstrual problem and pelvic pain.  Musculoskeletal: Positive for neck pain. Negative for arthralgias, back pain, gait problem, joint swelling, myalgias and neck stiffness.  Skin: Negative for color change, pallor, rash and wound.  Allergic/Immunologic: Negative for environmental allergies, food allergies and immunocompromised state.  Neurological: Negative for dizziness, tremors, seizures, syncope, facial asymmetry, speech difficulty, weakness, light-headedness, numbness and headaches.  Hematological: Negative for adenopathy. Does not bruise/bleed easily.  Psychiatric/Behavioral: Negative for suicidal ideas, hallucinations, behavioral problems, confusion, sleep disturbance, self-injury, dysphoric mood, decreased concentration and agitation. The patient is not nervous/anxious and is not hyperactive.     Past Medical History  Diagnosis Date  . Anemia   . Thyroid disease 07/07/2002    Hypothyroidism  . Arthritis     Cervical DDD C4, C7; L foot  . Osteopenia 07/07/2009  . Allergy    Past Surgical History  Procedure Laterality Date  . Ovarian cyst removed  1977  . Ovarian scar tissue removal  1987  . 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 Colonoscopy  09/04/2012    normal; [facility].  Repeat in 10 years.  . Tonsillectomy  1959   Allergies  Allergen Reactions  . Penicillins Other (See Comments)    Not sure if she is allergic but it was in her chart   Current Outpatient Prescriptions  Medication Sig Dispense Refill  . ascorbic acid (VITAMIN C) 500 MG tablet Take 500 mg by mouth daily.      Marland Kitchen b complex vitamins tablet Take 1 tablet by mouth daily.      . calcium citrate-vitamin D 200-200 MG-UNIT TABS Take 1 tablet by mouth  daily.      . Cholecalciferol (VITAMIN D3) 2000 UNITS TABS Take by mouth daily.      Marland Kitchen  levothyroxine (SYNTHROID, LEVOTHROID) 50 MCG tablet Take 50 mcg by mouth daily.      Marland Kitchen RESVERATROL PO Take 30 mg by mouth daily.      . Selenium 200 MCG TABS Take by mouth daily.      . Wheat Dextrin (BENEFIBER PO) Take by mouth.       No current facility-administered medications for this visit.   History   Social History  . Marital Status: Married    Spouse Name: N/A    Number of Children: N/A  . Years of Education: N/A   Occupational History  . Not on file.   Social History Main Topics  . Smoking status: Never Smoker   . Smokeless tobacco: Never Used  . Alcohol Use: 8.4 oz/week    14 Glasses of wine per week     Comment: wine 1-2/ 5x  . Drug Use: No  . Sexual Activity: Yes    Birth Control/ Protection: None   Other Topics Concern  . Not on file   Social History Narrative   Marital status: married x 22 years; happily married; no abuse; widowed 1988.      Children: stepchildren 3; 8 grandchildren; 1 great grandchildren.      Lives: with husband.      Employment:  Unemployed. Homemaker.      Tobacco: none       Alcohol:  1-2 glasses of wine five days per week.       Drugs: none      Exercise:  Walking 2-3 days per week; swimming.  Yoga.        Seatbelt:  100%      Guns:  Asks for response not to be recorded in chart.   Family History  Problem Relation Age of Onset  . Hypertension Mother   . Hyperlipidemia Mother   . Dementia Mother   . Hyperlipidemia Father   . Hypertension Father   . Cancer Father 50    melanoma  . Cancer Maternal Grandmother   . Cancer Maternal Grandfather   . Cancer Paternal Grandmother     ovarian       Objective:    BP 112/70  Pulse 61  Temp(Src) 97.8 F (36.6 C) (Oral)  Resp 16  Ht 5' 2.5" (1.588 m)  Wt 118 lb (53.524 kg)  BMI 21.22 kg/m2  SpO2 100%  LMP 03/04/2004 Physical Exam  Nursing note and vitals reviewed. Constitutional: She is oriented to person, place, and time. She appears well-developed and well-nourished. No  distress.  HENT:  Head: Normocephalic and atraumatic.  Right Ear: External ear normal.  Left Ear: External ear normal.  Nose: Nose normal.  Mouth/Throat: Oropharynx is clear and moist.  Eyes: Conjunctivae and EOM are normal. Pupils are equal, round, and reactive to light.  Neck: Normal range of motion and full passive range of motion without pain. Neck supple. No JVD present. Carotid bruit is not present. No thyromegaly present.  Cardiovascular: Normal rate, regular rhythm and normal heart sounds.  Exam reveals no gallop and no friction rub.   No murmur heard. Pulmonary/Chest: Effort normal and breath sounds normal. She has no wheezes. She has no rales. Right breast exhibits no inverted nipple, no mass, no nipple discharge, no skin change and no tenderness. Left breast exhibits no inverted nipple, no mass, no nipple discharge, no skin change  and no tenderness. Breasts are symmetrical.  Abdominal: Soft. Bowel sounds are normal. She exhibits no distension and no mass. There is no tenderness. There is no rebound and no guarding.  Genitourinary: Vagina normal and uterus normal. No breast swelling, tenderness, discharge or bleeding. Pelvic exam was performed with patient supine. There is no rash, tenderness or lesion on the right labia. There is no rash, tenderness or lesion on the left labia. Cervix exhibits no motion tenderness. Right adnexum displays no mass, no tenderness and no fullness. Left adnexum displays no mass, no tenderness and no fullness.  Musculoskeletal:       Right shoulder: Normal.       Left shoulder: Normal.       Cervical back: Normal.  Lymphadenopathy:    She has no cervical adenopathy.  Neurological: She is alert and oriented to person, place, and time. She has normal reflexes. No cranial nerve deficit. She exhibits normal muscle tone. Coordination normal.  Skin: Skin is warm and dry. No rash noted. She is not diaphoretic. No erythema. No pallor.  Psychiatric: She has a  normal mood and affect. Her behavior is normal. Judgment and thought content normal.   Results for orders placed in visit on 02/01/14  CBC      Result Value Ref Range   WBC 3.2 (*) 4.0 - 10.5 K/uL   RBC 4.83  3.87 - 5.11 MIL/uL   Hemoglobin 15.5 (*) 12.0 - 15.0 g/dL   HCT 44.0  36.0 - 46.0 %   MCV 91.1  78.0 - 100.0 fL   MCH 32.1  26.0 - 34.0 pg   MCHC 35.2  30.0 - 36.0 g/dL   RDW 13.3  11.5 - 15.5 %   Platelets 289  150 - 400 K/uL  COMPREHENSIVE METABOLIC PANEL      Result Value Ref Range   Sodium 136  135 - 145 mEq/L   Potassium 4.1  3.5 - 5.3 mEq/L   Chloride 99  96 - 112 mEq/L   CO2 27  19 - 32 mEq/L   Glucose, Bld 77  70 - 99 mg/dL   BUN 12  6 - 23 mg/dL   Creat 0.75  0.50 - 1.10 mg/dL   Total Bilirubin 0.6  0.2 - 1.2 mg/dL   Alkaline Phosphatase 50  39 - 117 U/L   AST 21  0 - 37 U/L   ALT 17  0 - 35 U/L   Total Protein 7.4  6.0 - 8.3 g/dL   Albumin 5.1  3.5 - 5.2 g/dL   Calcium 9.7  8.4 - 10.5 mg/dL  LIPID PANEL      Result Value Ref Range   Cholesterol 269 (*) 0 - 200 mg/dL   Triglycerides 53  <150 mg/dL   HDL 109  >39 mg/dL   Total CHOL/HDL Ratio 2.5     VLDL 11  0 - 40 mg/dL   LDL Cholesterol 149 (*) 0 - 99 mg/dL  HEMOGLOBIN A1C      Result Value Ref Range   Hemoglobin A1C 5.2  <5.7 %   Mean Plasma Glucose 103  <117 mg/dL  VITAMIN D 25 HYDROXY      Result Value Ref Range   Vit D, 25-Hydroxy 71  30 - 89 ng/mL  TSH      Result Value Ref Range   TSH 1.353  0.350 - 4.500 uIU/mL  POCT URINALYSIS DIPSTICK      Result Value Ref Range   Color, UA  lt yellow     Clarity, UA clear     Glucose, UA neg     Bilirubin, UA neg     Ketones, UA neg     Spec Grav, UA 1.010     Blood, UA neg     pH, UA 7.0     Protein, UA neg     Urobilinogen, UA 0.2     Nitrite, UA neg     Leukocytes, UA Negative         Assessment & Plan:   1. Screening for diabetes mellitus   2. Routine general medical examination at a health care facility   3. Routine gynecological  examination   4. Unspecified hypothyroidism   5. Pure hypercholesterolemia   6. Osteopenia    1. Complete Physical Examination: anticipatory guidance --- exercise, weight maintenance, 3 servings of dairy daily.  Pap smear UTD in 2014.  Mammogram UTD.  Colonoscopy UTD.  Immunizations UTD.  Refer for bone density scan. 2.  Gynecological exam: pap smear UTD in 2014. Mammogram UTD. Post-menopausal.   3.  Screening for DMII: obtain glucose, HgbA1c. 4.  Hypercholesterolemia: stable; obtain FLP. 5.  Hypothyroidism: stable; obtain labs and fax to Dr. Forde Dandy; managed by Dr. Forde Dandy of endocrinology. 6. Osteopenia: stable; refer for bone density scan; obtain Vitamin D level; continue calcium supplement and weight bearing exercises daily.  Meds ordered this encounter  Medications  . Selenium 200 MCG TABS    Sig: Take by mouth daily.  Marland Kitchen RESVERATROL PO    Sig: Take 30 mg by mouth daily.  . Cholecalciferol (VITAMIN D3) 2000 UNITS TABS    Sig: Take by mouth daily.    Return in about 1 year (around 02/02/2015) for complete physical examiniation.   Reginia Forts, M.D.  [facility] [address] [place], [place]  [address] [phone] phone [phone] fax

## 2014-02-02 LAB — HEMOGLOBIN A1C
Hgb A1c MFr Bld: 5.2 % (ref <5.7)
Mean Plasma Glucose: 103 mg/dL (ref <117)

## 2014-02-02 LAB — TSH: TSH: 1.353 u[IU]/mL (ref 0.350–4.500)

## 2014-02-02 LAB — VITAMIN D 25 HYDROXY (VIT D DEFICIENCY, FRACTURES): Vit D, 25-Hydroxy: 71 ng/mL (ref 30–89)

## 2014-02-05 ENCOUNTER — Encounter: Payer: Self-pay | Admitting: Family Medicine

## 2014-02-05 DIAGNOSIS — M8580 Other specified disorders of bone density and structure, unspecified site: Secondary | ICD-10-CM | POA: Insufficient documentation

## 2014-02-05 DIAGNOSIS — E7800 Pure hypercholesterolemia, unspecified: Secondary | ICD-10-CM | POA: Insufficient documentation

## 2014-02-05 DIAGNOSIS — E039 Hypothyroidism, unspecified: Secondary | ICD-10-CM | POA: Insufficient documentation

## 2014-03-29 ENCOUNTER — Ambulatory Visit (INDEPENDENT_AMBULATORY_CARE_PROVIDER_SITE_OTHER): Admitting: *Deleted

## 2014-03-29 DIAGNOSIS — Z23 Encounter for immunization: Secondary | ICD-10-CM

## 2014-04-24 ENCOUNTER — Ambulatory Visit (INDEPENDENT_AMBULATORY_CARE_PROVIDER_SITE_OTHER): Admitting: Internal Medicine

## 2014-04-24 VITALS — BP 122/74 | HR 88 | Temp 98.3°F | Resp 18 | Ht 62.5 in | Wt 118.0 lb

## 2014-04-24 DIAGNOSIS — T162XXA Foreign body in left ear, initial encounter: Secondary | ICD-10-CM

## 2014-04-24 DIAGNOSIS — H9193 Unspecified hearing loss, bilateral: Secondary | ICD-10-CM

## 2014-04-24 MED ORDER — NEOMYCIN-POLYMYXIN-HC 3.5-10000-1 OT SOLN: 3.0000 [drp] | Freq: Three times a day (TID) | OTIC | Status: DC

## 2014-04-24 NOTE — Patient Instructions (Signed)

## 2014-04-24 NOTE — Progress Notes (Signed)
   Subjective:    Patient ID: Madison Yang, female    DOB: 1950/11/18, 63 y.o.   MRN: [id number]  HPI Hx of recurrent cerumen impaction. Uses qtips at home.   Review of Systems     Objective:   Physical Exam  Vitals reviewed. Constitutional: She is oriented to person, place, and time. She appears well-developed and well-nourished. No distress.  HENT:  Head: Normocephalic.  Right Ear: No swelling or tenderness. A foreign body is present. Decreased hearing is noted.  Left Ear: No swelling or tenderness. A foreign body is present. Decreased hearing is noted.  Eyes: EOM are normal. Pupils are equal, round, and reactive to light.  Neck: Normal range of motion.  Cardiovascular: Normal rate.   Pulmonary/Chest: Effort normal.  Neurological: She is alert and oriented to person, place, and time. She exhibits normal muscle tone. Coordination normal.  Psychiatric: She has a normal mood and affect.   Left canal has cotton adherent near TM Right canal persistent cerumen       Assessment & Plan:  FB ear canals improved with irrigation Refer to ENT

## 2014-05-11 ENCOUNTER — Ambulatory Visit (INDEPENDENT_AMBULATORY_CARE_PROVIDER_SITE_OTHER)

## 2014-05-11 ENCOUNTER — Ambulatory Visit (INDEPENDENT_AMBULATORY_CARE_PROVIDER_SITE_OTHER): Admitting: Internal Medicine

## 2014-05-11 VITALS — BP 120/78 | HR 73 | Temp 97.5°F | Resp 18 | Ht 63.0 in | Wt 117.0 lb

## 2014-05-11 DIAGNOSIS — R059 Cough, unspecified: Secondary | ICD-10-CM

## 2014-05-11 DIAGNOSIS — J209 Acute bronchitis, unspecified: Secondary | ICD-10-CM

## 2014-05-11 DIAGNOSIS — Z902 Acquired absence of lung [part of]: Secondary | ICD-10-CM

## 2014-05-11 DIAGNOSIS — Z9889 Other specified postprocedural states: Secondary | ICD-10-CM

## 2014-05-11 DIAGNOSIS — R05 Cough: Secondary | ICD-10-CM

## 2014-05-11 DIAGNOSIS — T148XXA Other injury of unspecified body region, initial encounter: Secondary | ICD-10-CM

## 2014-05-11 LAB — POCT CBC
Granulocyte percent: 73.7 %G (ref 37–80)
HCT, POC: 45.7 % (ref 37.7–47.9)
Hemoglobin: 15.3 g/dL (ref 12.2–16.2)
LYMPH, POC: 1.1 (ref 0.6–3.4)
MCH: 31.8 pg — AB (ref 27–31.2)
MCHC: 33.4 g/dL (ref 31.8–35.4)
MCV: 95.2 fL (ref 80–97)
MID (CBC): 0.4 (ref 0–0.9)
MPV: 6.1 fL (ref 0–99.8)
PLATELET COUNT, POC: 330 10*3/uL (ref 142–424)
POC Granulocyte: 4.2 (ref 2–6.9)
POC LYMPH %: 19 % (ref 10–50)
POC MID %: 7.3 %M (ref 0–12)
RBC: 4.8 M/uL (ref 4.04–5.48)
RDW, POC: 13.4 %
WBC: 5.7 10*3/uL (ref 4.6–10.2)

## 2014-05-11 MED ORDER — AZITHROMYCIN 250 MG PO TABS: ORAL_TABLET | ORAL | Status: DC

## 2014-05-11 MED ORDER — ALBUTEROL SULFATE (2.5 MG/3ML) 0.083% IN NEBU
2.5000 mg | INHALATION_SOLUTION | Freq: Once | RESPIRATORY_TRACT | Status: AC
  Administered 2014-05-11: 2.5 mg via RESPIRATORY_TRACT

## 2014-05-11 MED ORDER — IPRATROPIUM BROMIDE 0.02 % IN SOLN
0.5000 mg | Freq: Once | RESPIRATORY_TRACT | Status: AC
  Administered 2014-05-11: 0.5 mg via RESPIRATORY_TRACT

## 2014-05-11 MED ORDER — ALBUTEROL SULFATE HFA 108 (90 BASE) MCG/ACT IN AERS
2.0000 | INHALATION_SPRAY | Freq: Four times a day (QID) | RESPIRATORY_TRACT | Status: DC | PRN
Start: 2014-05-11 — End: 2015-10-30

## 2014-05-11 MED ORDER — HYDROCOD POLST-CHLORPHEN POLST 10-8 MG/5ML PO LQCR: 5.0000 mL | Freq: Two times a day (BID) | ORAL | Status: DC | PRN

## 2014-05-11 MED ORDER — FLUTICASONE PROPIONATE 50 MCG/ACT NA SUSP: 2.0000 | Freq: Every day | NASAL | Status: DC

## 2014-05-11 MED ORDER — CETIRIZINE HCL 10 MG PO TABS: 10.0000 mg | ORAL_TABLET | Freq: Every day | ORAL | Status: DC

## 2014-05-11 NOTE — Progress Notes (Signed)
Subjective:    Patient ID: Madison Yang, female    DOB: 06-04-1951, 63 y.o.   MRN: [id number]  HPI  63 year old Caucasian female presents to the clinic today for cough symptoms x 2 weeks.   No product with cough, no fever.  Pt admits to stuffy nose.  Denies any other symptoms.  Pt denies seasonal allergies.  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  Pt is non smoker.  Pt admits to husband having similar cough and attributes this to allergies. 2-3 weeks ago she had an respiratory illness. She has new DOE on stair climbing. No sneezing or hx of allergys. Does have cats    Review of Systems     Objective:   Physical Exam  Constitutional: She is oriented to person, place, and time. She appears well-developed and well-nourished. No distress.  HENT:  Head: Not macrocephalic and not microcephalic.  Right Ear: External ear normal.  Left Ear: External ear normal.  Nose: Mucosal edema, rhinorrhea and sinus tenderness present. Right sinus exhibits no maxillary sinus tenderness and no frontal sinus tenderness. Left sinus exhibits no maxillary sinus tenderness and no frontal sinus tenderness.  Mouth/Throat: Oropharynx is clear and moist.  Eyes: EOM are normal. No scleral icterus.  Neck: Normal range of motion. Neck supple. No thyromegaly present.  Cardiovascular: Normal rate, regular rhythm and normal heart sounds.   Pulmonary/Chest: Effort normal. No tachypnea. No respiratory distress. She has decreased breath sounds. She has no wheezes. She has no rhonchi. She has no rales. She exhibits no tenderness.  PFR 180  Normal 380  Nebulizer given  Neurological: She is alert and oriented to person, place, and time. She exhibits normal muscle tone. Coordination normal.  Psychiatric: She has a normal mood and affect. Her behavior is normal. Judgment and thought content normal.   Results for orders placed or performed in visit on 05/11/14  POCT CBC  Result  Value Ref Range   WBC 5.7 4.6 - 10.2 K/uL   Lymph, poc 1.1 0.6 - 3.4   POC LYMPH PERCENT 19.0 10 - 50 %L   MID (cbc) 0.4 0 - 0.9   POC MID % 7.3 0 - 12 %M   POC Granulocyte 4.2 2 - 6.9   Granulocyte percent 73.7 37 - 80 %G   RBC 4.80 4.04 - 5.48 M/uL   Hemoglobin 15.3 12.2 - 16.2 g/dL   HCT, POC 45.7 37.7 - 47.9 %   MCV 95.2 80 - 97 fL   MCH, POC 31.8 (A) 27 - 31.2 pg   MCHC 33.4 31.8 - 35.4 g/dL   RDW, POC 13.4 %   Platelet Count, POC 330 142 - 424 K/uL   MPV 6.1 0 - 10.9 fL    [facility] Policy for Prescribing Controlled Substances (Revised 05/2012) 1. Prescriptions for controlled substances will be filled by ONE provider at [facility] with whom you have established and developed a plan for your care, including follow-up. 2. You are encouraged to schedule an appointment with your prescriber at our appointment center for follow-up visits whenever possible. 3. If you request a prescription for the controlled substance while at [facility] for an acute problem (with someone other than your regular prescriber), you MAY be given a ONE-TIME prescription for a 30-day supply of the controlled substance, to allow time for you to return to see your regular prescriber for additional prescriptions.  [facility] reading (PRIMARY) by  DrGuest cxr clear sp lobectomy.  Nebulizer done     Assessment & Plan:  Cough Hx of lung lobectomy benign Allergy versus infection

## 2014-05-11 NOTE — Patient Instructions (Signed)
Bronchospasm A bronchospasm is a spasm or tightening of the airways going into the lungs. During a bronchospasm breathing becomes more difficult because the airways get smaller. When this happens there can be coughing, a whistling sound when breathing (wheezing), and difficulty breathing. Bronchospasm is often associated with asthma, but not all patients who experience a bronchospasm have asthma. CAUSES  A bronchospasm is caused by inflammation or irritation of the airways. The inflammation or irritation may be triggered by:   Allergies (such as to animals, pollen, food, or mold). Allergens that cause bronchospasm may cause wheezing immediately after exposure or many hours later.   Infection. Viral infections are believed to be the most common cause of bronchospasm.   Exercise.   Irritants (such as pollution, cigarette smoke, strong odors, aerosol sprays, and paint fumes).   Weather changes. Winds increase molds and pollens in the air. Rain refreshes the air by washing irritants out. Cold air may cause inflammation.   Stress and emotional upset.  SIGNS AND SYMPTOMS   Wheezing.   Excessive nighttime coughing.   Frequent or severe coughing with a simple cold.   Chest tightness.   Shortness of breath.  DIAGNOSIS  Bronchospasm is usually diagnosed through a history and physical exam. Tests, such as chest X-rays, are sometimes done to look for other conditions. TREATMENT   Inhaled medicines can be given to open up your airways and help you breathe. The medicines can be given using either an inhaler or a nebulizer machine.  Corticosteroid medicines may be given for severe bronchospasm, usually when it is associated with asthma. HOME CARE INSTRUCTIONS   Always have a plan prepared for seeking medical care. Know when to call your health care provider and local emergency services (911 in the [place] Know where you can access local emergency care.  Only take medicines as  directed by your health care provider.  If you were prescribed an inhaler or nebulizer machine, ask your health care provider to explain how to use it correctly. Always use a spacer with your inhaler if you were given one.  It is necessary to remain calm during an attack. Try to relax and breathe more slowly.  Control your home environment in the following ways:   Change your heating and air conditioning filter at least once a month.   Limit your use of fireplaces and wood stoves.  Do not smoke and do not allow smoking in your home.   Avoid exposure to perfumes and fragrances.   Get rid of pests (such as roaches and mice) and their droppings.   Throw away plants if you see mold on them.   Keep your house clean and dust free.   Replace carpet with wood, tile, or vinyl flooring. Carpet can trap dander and dust.   Use allergy-proof pillows, mattress covers, and box spring covers.   Wash bed sheets and blankets every week in hot water and dry them in a dryer.   Use blankets that are made of polyester or cotton.   Wash hands frequently. SEEK MEDICAL CARE IF:   You have muscle aches.   You have chest pain.   The sputum changes from clear or white to yellow, green, gray, or bloody.   The sputum you cough up gets thicker.   There are problems that may be related to the medicine you are given, such as a rash, itching, swelling, or trouble breathing.  SEEK IMMEDIATE MEDICAL CARE IF:   You have worsening wheezing and coughing even   after taking your prescribed medicines.   You have increased difficulty breathing.   You develop severe chest pain. MAKE SURE YOU:   Understand these instructions.  Will watch your condition.  Will get help right away if you are not doing well or get worse. Document Released: 06/26/2003 Document Revised: 06/28/2013 Document Reviewed: 12/13/2012 [facility] Patient Information 2015 [place], [place]. This information is not  intended to replace advice given to you by your health care provider. Make sure you discuss any questions you have with your health care provider. Allergic Rhinitis Allergic rhinitis is when the mucous membranes in the nose respond to allergens. Allergens are particles in the air that cause your body to have an allergic reaction. This causes you to release allergic antibodies. Through a chain of events, these eventually cause you to release histamine into the blood stream. Although meant to protect the body, it is this release of histamine that causes your discomfort, such as frequent sneezing, congestion, and an itchy, runny nose.  CAUSES  Seasonal allergic rhinitis (hay fever) is caused by pollen allergens that may come from grasses, trees, and weeds. Year-round allergic rhinitis (perennial allergic rhinitis) is caused by allergens such as house dust mites, pet dander, and mold spores.  SYMPTOMS   Nasal stuffiness (congestion).  Itchy, runny nose with sneezing and tearing of the eyes. DIAGNOSIS  Your health care provider can help you determine the allergen or allergens that trigger your symptoms. If you and your health care provider are unable to determine the allergen, skin or blood testing may be used. TREATMENT  Allergic rhinitis does not have a cure, but it can be controlled by:  Medicines and allergy shots (immunotherapy).  Avoiding the allergen. Hay fever may often be treated with antihistamines in pill or nasal spray forms. Antihistamines block the effects of histamine. There are over-the-counter medicines that may help with nasal congestion and swelling around the eyes. Check with your health care provider before taking or giving this medicine.  If avoiding the allergen or the medicine prescribed do not work, there are many new medicines your health care provider can prescribe. Stronger medicine may be used if initial measures are ineffective. Desensitizing injections can be used if  medicine and avoidance does not work. Desensitization is when a patient is given ongoing shots until the body becomes less sensitive to the allergen. Make sure you follow up with your health care provider if problems continue. HOME CARE INSTRUCTIONS It is not possible to completely avoid allergens, but you can reduce your symptoms by taking steps to limit your exposure to them. It helps to know exactly what you are allergic to so that you can avoid your specific triggers. SEEK MEDICAL CARE IF:   You have a fever.  You develop a cough that does not stop easily (persistent).  You have shortness of breath.  You start wheezing.  Symptoms interfere with normal daily activities. Document Released: 03/18/2001 Document Revised: 06/28/2013 Document Reviewed: 02/28/2013 [facility] Patient Information 2015 [place], [place]. This information is not intended to replace advice given to you by your health care provider. Make sure you discuss any questions you have with your health care provider.

## 2014-06-21 ENCOUNTER — Encounter: Payer: Self-pay | Admitting: Family Medicine

## 2015-04-17 ENCOUNTER — Ambulatory Visit (INDEPENDENT_AMBULATORY_CARE_PROVIDER_SITE_OTHER)

## 2015-04-17 DIAGNOSIS — Z23 Encounter for immunization: Secondary | ICD-10-CM

## 2015-05-28 LAB — HM MAMMOGRAPHY

## 2015-06-05 ENCOUNTER — Encounter: Payer: Self-pay | Admitting: Family Medicine

## 2015-08-07 ENCOUNTER — Ambulatory Visit: Admitting: Physician Assistant

## 2015-08-28 ENCOUNTER — Encounter: Admitting: Physician Assistant

## 2015-10-30 ENCOUNTER — Encounter: Payer: Self-pay | Admitting: Physician Assistant

## 2015-10-30 ENCOUNTER — Ambulatory Visit (INDEPENDENT_AMBULATORY_CARE_PROVIDER_SITE_OTHER): Admitting: Physician Assistant

## 2015-10-30 VITALS — BP 136/85 | HR 65 | Temp 98.0°F | Resp 16 | Ht 62.75 in | Wt 114.0 lb

## 2015-10-30 DIAGNOSIS — F411 Generalized anxiety disorder: Secondary | ICD-10-CM

## 2015-10-30 DIAGNOSIS — Z114 Encounter for screening for human immunodeficiency virus [HIV]: Secondary | ICD-10-CM | POA: Diagnosis not present

## 2015-10-30 DIAGNOSIS — E7800 Pure hypercholesterolemia, unspecified: Secondary | ICD-10-CM | POA: Diagnosis not present

## 2015-10-30 DIAGNOSIS — R2241 Localized swelling, mass and lump, right lower limb: Secondary | ICD-10-CM

## 2015-10-30 DIAGNOSIS — L409 Psoriasis, unspecified: Secondary | ICD-10-CM | POA: Diagnosis not present

## 2015-10-30 DIAGNOSIS — Z1159 Encounter for screening for other viral diseases: Secondary | ICD-10-CM | POA: Diagnosis not present

## 2015-10-30 DIAGNOSIS — E039 Hypothyroidism, unspecified: Secondary | ICD-10-CM | POA: Diagnosis not present

## 2015-10-30 DIAGNOSIS — Z130 Encounter for screening for diseases of the blood and blood-forming organs and certain disorders involving the immune mechanism: Secondary | ICD-10-CM

## 2015-10-30 DIAGNOSIS — Z0000 Encounter for general adult medical examination without abnormal findings: Secondary | ICD-10-CM

## 2015-10-30 DIAGNOSIS — M8580 Other specified disorders of bone density and structure, unspecified site: Secondary | ICD-10-CM

## 2015-10-30 DIAGNOSIS — Z1321 Encounter for screening for nutritional disorder: Secondary | ICD-10-CM | POA: Diagnosis not present

## 2015-10-30 DIAGNOSIS — M5030 Other cervical disc degeneration, unspecified cervical region: Secondary | ICD-10-CM | POA: Insufficient documentation

## 2015-10-30 LAB — CBC WITH DIFFERENTIAL/PLATELET
BASOS PCT: 2 %
Basophils Absolute: 74 cells/uL (ref 0–200)
Eosinophils Absolute: 111 cells/uL (ref 15–500)
Eosinophils Relative: 3 %
HEMATOCRIT: 42.5 % (ref 35.0–45.0)
Hemoglobin: 14.5 g/dL (ref 11.7–15.5)
LYMPHS PCT: 18 %
Lymphs Abs: 666 cells/uL — ABNORMAL LOW (ref 850–3900)
MCH: 31.2 pg (ref 27.0–33.0)
MCHC: 34.1 g/dL (ref 32.0–36.0)
MCV: 91.4 fL (ref 80.0–100.0)
MONO ABS: 333 {cells}/uL (ref 200–950)
MONOS PCT: 9 %
MPV: 8.5 fL (ref 7.5–12.5)
NEUTROS ABS: 2516 {cells}/uL (ref 1500–7800)
Neutrophils Relative %: 68 %
PLATELETS: 285 10*3/uL (ref 140–400)
RBC: 4.65 MIL/uL (ref 3.80–5.10)
RDW: 13.9 % (ref 11.0–15.0)
WBC: 3.7 10*3/uL — AB (ref 3.8–10.8)

## 2015-10-30 LAB — HIV ANTIBODY (ROUTINE TESTING W REFLEX): HIV 1&2 Ab, 4th Generation: NONREACTIVE

## 2015-10-30 LAB — LIPID PANEL
Cholesterol: 283 mg/dL — ABNORMAL HIGH (ref 125–200)
HDL: 107 mg/dL (ref >=46)
LDL CALC: 160 mg/dL — AB (ref <130)
Total CHOL/HDL Ratio: 2.6 Ratio (ref <=5.0)
Triglycerides: 82 mg/dL (ref <150)
VLDL: 16 mg/dL (ref <30)

## 2015-10-30 LAB — COMPREHENSIVE METABOLIC PANEL
ALK PHOS: 43 U/L (ref 33–130)
ALT: 21 U/L (ref 6–29)
AST: 22 U/L (ref 10–35)
Albumin: 4.5 g/dL (ref 3.6–5.1)
BILIRUBIN TOTAL: 0.6 mg/dL (ref 0.2–1.2)
BUN: 8 mg/dL (ref 7–25)
CO2: 27 mmol/L (ref 20–31)
Calcium: 9.3 mg/dL (ref 8.6–10.4)
Chloride: 98 mmol/L (ref 98–110)
Creat: 0.74 mg/dL (ref 0.50–0.99)
GLUCOSE: 79 mg/dL (ref 65–99)
Potassium: 4 mmol/L (ref 3.5–5.3)
Sodium: 136 mmol/L (ref 135–146)
TOTAL PROTEIN: 6.8 g/dL (ref 6.1–8.1)

## 2015-10-30 LAB — VITAMIN D 25 HYDROXY (VIT D DEFICIENCY, FRACTURES): VIT D 25 HYDROXY: 37 ng/mL (ref 30–100)

## 2015-10-30 LAB — HEPATITIS C ANTIBODY: HCV Ab: NEGATIVE

## 2015-10-30 LAB — VITAMIN B12: VITAMIN B 12: 625 pg/mL (ref 200–1100)

## 2015-10-30 NOTE — Progress Notes (Signed)
Patient ID: Madison Yang, female    DOB: 02-03-1951, 65 y.o.   MRN: [id number]  PCP: Reginia Forts, MD  Chief Complaint  Patient presents with  . Annual Exam  . Gynecologic Exam    Subjective:   HPI: Presents for Annual Exam and possible pap test.  Last physical: 07/2012. Pap smear: 07/21/12 WNL HPV negative. No history of abnormal pap. Next due 2019. Mammogram: 05/2015 [facility]. Bone density: 11/29/2009, Osteopenia.Did not update in 2014 when recommended. Colonoscopy: 09/28/12 WNL; [facility]. Repeat in 10 years. TDAP: 2010 Pneumovax: never; not yet indicated Zostavax: 05/07/2012. Influenza: 04/17/2015 Eye exam: +glasses; 2014; no gluacoma or cataracts. Dental exam: every six months.   Patient has been doing well. She has had recurrence of her psoriasis. She is followed by a dermatologist in [facility] who prescribes her medication for for psoriasis. She have developed "spots" on her breast, pubic area, back and behind both her ears. She is applying several lotions, gels, and solutions as prescribed by dermatologist. She is going to make an appointment next month.  Patient also complains of a hard nodule on the dorsolateral surface of the foot. Patient states it has been there for two years. She denies any pain or erythema. No fevers or chills.  She also endorses mild anxiety over the past year. There is a significant amount of family stress. She feels very jittery and anxious inside at times. She tries to direct her energy towards walking and socializing. She denies any panic attacks or difficulty sleeping.  Patient also sees Dr. Forde Dandy with endocrinology who follows her hypothyroidism.   She is requesting we check her vitamin B12 and D level.   Patient Active Problem List   Diagnosis Date Noted  . Psoriasis 10/30/2015  . DDD (degenerative disc disease), cervical 10/30/2015  . Osteopenia 02/05/2014  . Pure hypercholesterolemia 02/05/2014  . Hypothyroidism 02/05/2014     Past Medical History  Diagnosis Date  . Thyroid disease 07/07/2002    Hypothyroidism  . Arthritis     Cervical DDD C4, C7; L foot  . Osteopenia 07/07/2009  . Allergy   . Psoriasis      Prior to Admission medications   Medication Sig Start Date End Date Taking? Authorizing Provider  ascorbic acid (VITAMIN C) 500 MG tablet Take 500 mg by mouth daily.   Yes Historical Provider, MD  b complex vitamins tablet Take 1 tablet by mouth daily.   Yes Historical Provider, MD  Cholecalciferol (VITAMIN D3) 2000 UNITS TABS Take by mouth daily.   Yes Historical Provider, MD  Fish Oil OIL by Does not apply route.   Yes Historical Provider, MD  levothyroxine (SYNTHROID, LEVOTHROID) 50 MCG tablet Take 50 mcg by mouth daily.   Yes Historical Provider, MD  RESVERATROL PO Take 30 mg by mouth daily.   Yes Historical Provider, MD  Wheat Dextrin (BENEFIBER PO) Take by mouth.   Yes Historical Provider, MD  calcipotriene (DOVONOX) 0.005 % cream  08/03/15   Historical Provider, MD  clobetasol (OLUX) 0.05 % topical foam APPLY TO AFFECTED AREA UP TO 2 TIMES DAILY AS NEEDED 10/02/15   Historical Provider, MD  clobetasol (TEMOVATE) 0.05 % external solution  10/29/15   Historical Provider, MD    Allergies  Allergen Reactions  . Penicillins Other (See Comments)    Not sure if she is allergic but it was in her chart    Past Surgical History  Procedure Laterality Date  . Ovarian cyst removed Left 1977  . Ovarian  scar tissue removal  1987  . 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 Colonoscopy  09/04/2012    normal; [facility].  Repeat in 10 years.  . Tonsillectomy  1959    Family History  Problem Relation Age of Onset  . Hypertension Mother   . Hyperlipidemia Mother   . Dementia Mother   . Hyperlipidemia Father   . Hypertension Father   . Cancer Father 64    melanoma  . Cancer Maternal Grandmother   . Cancer Maternal Grandfather   . Cancer Paternal Grandmother     ovarian    Social History    Social History  . Marital Status: Married    Spouse Name: N/A  . Number of Children: 3  . Years of Education: College   Occupational History  . Retired    Social History Main Topics  . Smoking status: Never Smoker   . Smokeless tobacco: Never Used  . Alcohol Use: 8.4 oz/week    14 Glasses of wine per week     Comment: wine 1-2/ 5x  . Drug Use: No  . Sexual Activity: Yes    Birth Control/ Protection: None   Other Topics Concern  . None   Social History Narrative   Marital status: married x 22 years; happily married; no abuse; widowed 1988.      Children: stepchildren 3; 8 grandchildren; 1 great grandchildren.      Lives: with husband.      Employment:  Unemployed. Homemaker.      Tobacco: none       Alcohol:  1-2 glasses of wine five days per week.       Drugs: none      Exercise:  Walking 2-3 days per week; swimming.  Yoga.        Seatbelt:  100%      Guns:  Asks for response not to be recorded in chart.       Review of Systems As above. Constitutional: Negative for fever and chills.  HENT: Negative.  Eyes: Negative.  Respiratory: Negative for cough, shortness of breath and wheezing.  Cardiovascular: Negative for chest pain, palpitations and leg swelling.  Gastrointestinal: Negative for nausea, vomiting, abdominal pain and diarrhea.  Genitourinary: Negative.  Neurological: Negative for dizziness, syncope, weakness, light-headedness and headaches.      Objective:  Physical Exam  Constitutional: She is oriented to person, place, and time. Vital signs are normal. She appears well-developed and well-nourished. She is active and cooperative. No distress.  BP 136/85 mmHg  Pulse 65  Temp(Src) 98 F (36.7 C)  Resp 16  Ht 5' 2.75" (1.594 m)  Wt 114 lb (51.71 kg)  BMI 20.35 kg/m2  LMP 03/04/2004   HENT:  Head: Normocephalic and atraumatic.  Right Ear: Hearing, tympanic membrane, external ear and ear canal normal. No foreign bodies.  Left Ear: Hearing,  tympanic membrane, external ear and ear canal normal. No foreign bodies.  Nose: Nose normal.  Mouth/Throat: Uvula is midline, oropharynx is clear and moist and mucous membranes are normal. No oral lesions. Normal dentition. No dental abscesses or uvula swelling. No oropharyngeal exudate.  Eyes: Conjunctivae, EOM and lids are normal. Pupils are equal, round, and reactive to light. Right eye exhibits no discharge. Left eye exhibits no discharge. No scleral icterus.  Fundoscopic exam:      The right eye shows no arteriolar narrowing, no AV nicking, no exudate, no hemorrhage and no papilledema. The right eye shows  red reflex.       The left eye shows no arteriolar narrowing, no AV nicking, no exudate, no hemorrhage and no papilledema. The left eye shows red reflex.  Neck: Trachea normal, normal range of motion and full passive range of motion without pain. Neck supple. No spinous process tenderness and no muscular tenderness present. No thyroid mass and no thyromegaly present.  Cardiovascular: Normal rate, regular rhythm, normal heart sounds, intact distal pulses and normal pulses.   Pulmonary/Chest: Effort normal and breath sounds normal. Right breast exhibits no inverted nipple, no mass, no nipple discharge, no skin change and no tenderness. Left breast exhibits no inverted nipple, no mass, no nipple discharge, no skin change and no tenderness. Breasts are symmetrical.    2 small plaques, consistent with psoriasis on the anterior chest, just above the breasts. In the V of the anterior chest there are hyperpigmented lesions consistent with previous psoriasis flares, none are currently active.  Musculoskeletal: She exhibits no edema or tenderness.       Cervical back: Normal.       Thoracic back: Normal.       Lumbar back: Normal.       Feet:  Firm, non-mobile, non-tender lump of the dorsolateral foot, just distal to the lateral malleolus. Some mild hyperkeratosis of the overlying skin. No erythema,  edema, induration. Not fluctuant.  Lymphadenopathy:       Head (right side): No tonsillar, no preauricular, no posterior auricular and no occipital adenopathy present.       Head (left side): No tonsillar, no preauricular, no posterior auricular and no occipital adenopathy present.    She has no cervical adenopathy.       Right: No supraclavicular adenopathy present.       Left: No supraclavicular adenopathy present.  Neurological: She is alert and oriented to person, place, and time. She has normal strength and normal reflexes. No cranial nerve deficit. She exhibits normal muscle tone. Coordination and gait normal.  Skin: Skin is warm, dry and intact. No rash noted. She is not diaphoretic. No cyanosis or erythema. Nails show no clubbing.  Psychiatric: She has a normal mood and affect. Her speech is normal and behavior is normal. Judgment and thought content normal.           Assessment & Plan:  1. Annual physical exam Age appropriate anticipatory guidance provided.  2. Screening for HIV (human immunodeficiency virus) - HIV antibody  3. Need for hepatitis C screening test - Hepatitis C antibody  4. Screening for deficiency anemia - CBC with Differential/Platelet - Vitamin B12  5. Encounter for vitamin deficiency screening 6. Osteopenia Check vitamin D level and update DEXA. Continue calcuim supplement and health yeating, weight bearing exercise. - VITAMIN D 25 Hydroxy (Vit-D Deficiency, Fractures) - DG Bone Density; Future  7. Pure hypercholesterolemia Await lab results. - Comprehensive metabolic panel - Lipid panel  8. Hypothyroidism, unspecified hypothyroidism type Continue management per Dr. Forde Dandy.  9. Psoriasis Stable. Continue management per Drs. Hall and [place].  10. Foot mass, right ?Bone cyst. As this is non-tender, not growing or changing in any way, elect to monitor for now. Would consider radiographs and referral to podiatry.  11. Anxiety state Not  terribly disruptful presently, not interested in treatment now.   Return in about 1 year (around 10/29/2016).     Fara Chute, PA-C Physician Assistant-Certified [facility]

## 2015-10-30 NOTE — Progress Notes (Signed)
Subjective:    Patient ID: Madison Yang, female    DOB: 05-01-1951, 65 y.o.   MRN: [id number]  Chief Complaint  Patient presents with  . Annual Exam  . Gynecologic Exam    HPI  Patient presents for complete physical exam.   PMH significant for hypothyroidism, arthritis, psoriasis, and osteopenia.  Patient has been doing well. She has had recurrence of her psoriasis. She is followed by a dermatologist in [facility] who prescribes her medication for for psoriasis. She have developed "spots" on her breast, pubic area, back and behind both her ears. She is applying several lotions, gels, and solutions as prescribed by dermatologist. She is going to make an appointment next month.  Patient also complains of a hard nodule on the dorsolateral surface of the foot. Patient states it has been there for two years. She denies any pain or erythema. No fevers or chills.  She also endorses mild anxiety over the past year. There is a significant amount of family stress. She feels very jittery and anxious inside at times. She tries to direct her energy towards walking and socializing. She denies any panic attacks or difficulty sleeping.  Patient also sees Dr. Forde Dandy with endocrinology who follows her hypothyroidism.   She is requesting we check her vitamin B12 and D level. Past Medical History  Diagnosis Date  . Thyroid disease 07/07/2002    Hypothyroidism  . Arthritis     Cervical DDD C4, C7; L foot  . Osteopenia 07/07/2009  . Allergy   . Psoriasis    Past Surgical History  Procedure Laterality Date  . Ovarian cyst removed Left 1977  . Ovarian scar tissue removal  1987  . 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 Colonoscopy  09/04/2012    normal; [facility].  Repeat in 10 years.  . Tonsillectomy  1959   Current Outpatient Prescriptions on File Prior to Visit  Medication Sig Dispense Refill  . ascorbic acid (VITAMIN C) 500 MG tablet Take 500 mg by mouth daily.    Marland Kitchen b complex  vitamins tablet Take 1 tablet by mouth daily.    . Cholecalciferol (VITAMIN D3) 2000 UNITS TABS Take by mouth daily.    . Fish Oil OIL by Does not apply route.    Marland Kitchen levothyroxine (SYNTHROID, LEVOTHROID) 50 MCG tablet Take 50 mcg by mouth daily.    Marland Kitchen RESVERATROL PO Take 30 mg by mouth daily.    . Wheat Dextrin (BENEFIBER PO) Take by mouth.     No current facility-administered medications on file prior to visit.   Allergies  Allergen Reactions  . Penicillins Other (See Comments)    Not sure if she is allergic but it was in her chart       Review of Systems  Constitutional: Negative for fever and chills.  HENT: Negative.   Eyes: Negative.   Respiratory: Negative for cough, shortness of breath and wheezing.   Cardiovascular: Negative for chest pain, palpitations and leg swelling.  Gastrointestinal: Negative for nausea, vomiting, abdominal pain and diarrhea.  Genitourinary: Negative.   Neurological: Negative for dizziness, syncope, weakness, light-headedness and headaches.       Objective:   Physical Exam  Constitutional: She is oriented to person, place, and time. Vital signs are normal. She appears well-developed and well-nourished. She is active and cooperative. No distress.  BP 136/85 mmHg  Pulse 65  Temp(Src) 98 F (36.7 C)  Resp 16  Ht 5' 2.75" (  1.594 m)  Wt 114 lb (51.71 kg)  BMI 20.35 kg/m2  LMP 03/04/2004 HENT:  Head: Normocephalic and atraumatic.  Right Ear: Hearing, tympanic membrane, external ear and ear canal normal. No foreign bodies.  Left Ear: Hearing, tympanic membrane, external ear and ear canal normal. No foreign bodies.  Nose: Nose normal.  Mouth/Throat: Uvula is midline, oropharynx is clear and moist and mucous membranes are normal. No oral lesions. Normal dentition. No dental abscesses or uvula swelling. No oropharyngeal exudate.  Eyes: Conjunctivae, EOM and lids are normal. Pupils are equal, round, and reactive to light. Right eye exhibits no  discharge. Left eye exhibits no discharge. No scleral icterus.  Fundoscopic exam:  The right eye shows no arteriolar narrowing, no AV nicking, no exudate, no hemorrhage and no papilledema. The right eye shows red reflex.   The left eye shows no arteriolar narrowing, no AV nicking, no exudate, no hemorrhage and no papilledema. The left eye shows red reflex.  Neck: Trachea normal, normal range of motion and full passive range of motion without pain. Neck supple. No spinous process tenderness and no muscular tenderness present. No thyroid mass and no thyromegaly present.  Cardiovascular: Normal rate, regular rhythm, normal heart sounds, intact distal pulses and normal pulses.  Pulmonary/Chest: Effort normal and breath sounds normal. Right breast exhibits no inverted nipple, no mass, no nipple discharge, no skin change and no tenderness. Left breast exhibits no inverted nipple, no mass, no nipple discharge, no skin change and no tenderness. Breasts are symmetrical.    2 small plaques, consistent with psoriasis on the anterior chest, just above the breasts. In the V of the anterior chest there are hyperpigmented lesions consistent with previous psoriasis flares, none are currently active.  Musculoskeletal: She exhibits no edema or tenderness.   Cervical back: Normal.   Thoracic back: Normal.   Lumbar back: Normal.   Feet:  Firm, non-mobile, non-tender lump of the dorsolateral foot, just distal to the lateral malleolus. Some mild hyperkeratosis of the overlying skin. No erythema, edema, induration. Not fluctuant.  Lymphadenopathy:   Head (right side): No tonsillar, no preauricular, no posterior auricular and no occipital adenopathy present.   Head (left side): No tonsillar, no preauricular, no posterior auricular and no occipital adenopathy present.   She has no cervical adenopathy.   Right: No supraclavicular adenopathy present.   Left: No supraclavicular  adenopathy present.  Neurological: She is alert and oriented to person, place, and time. She has normal strength and normal reflexes. No cranial nerve deficit. She exhibits normal muscle tone. Coordination and gait normal.  Skin: Skin is warm, dry and intact. No rash noted. She is not diaphoretic. No cyanosis or erythema. Nails show no clubbing.  Psychiatric: She has a normal mood and affect. Her speech is normal and behavior is normal. Judgment and thought content normal.         Assessment & Plan:  1. Annual physical exam Age appropriate anticipatory guidance provided.  2. Screening for HIV (human immunodeficiency virus) - HIV antibody  3. Need for hepatitis C screening test - Hepatitis C antibody  4. Screening for deficiency anemia - CBC with Differential/Platelet - Vitamin B12  5. Encounter for vitamin deficiency screening 6. Osteopenia Check vitamin D level and update DEXA. Continue calcuim supplement and health yeating, weight bearing exercise. - VITAMIN D 25 Hydroxy (Vit-D Deficiency, Fractures) - DG Bone Density; Future  7. Pure hypercholesterolemia Await lab results. - Comprehensive metabolic panel - Lipid panel  8. Hypothyroidism, unspecified hypothyroidism  type Continue management per Dr. Forde Dandy.  9. Psoriasis Stable. Continue management per Drs. Hall and [place].  10. Foot mass, right ?Bone cyst. As this is non-tender, not growing or changing in any way, elect to monitor for now. Would consider radiographs and referral to podiatry.  11. Anxiety state Not terribly disruptful presently, not interested in treatment now.   Return in about 1 year (around 10/29/2016).

## 2015-10-30 NOTE — Patient Instructions (Addendum)
   IF you received an x-ray today, you will receive an invoice from [facility]. Please contact [facility] at [phone] with questions or concerns regarding your invoice.   IF you received labwork today, you will receive an invoice from [facility]. Please contact [facility] at [phone] with questions or concerns regarding your invoice.   Our billing staff will not be able to assist you with questions regarding bills from these companies.  You will be contacted with the lab results as soon as they are available. The fastest way to get your results is to activate your My Chart account. Instructions are located on the last page of this paperwork. If you have not heard from us regarding the results in 2 weeks, please contact this office.    Keeping You Healthy  Get These Tests  Blood Pressure- Have your blood pressure checked by your healthcare provider at least once a year.  Normal blood pressure is 120/80.  Weight- Have your body mass index (BMI) calculated to screen for obesity.  BMI is a measure of body fat based on height and weight.  You can calculate your own BMI at www.nhlbisupport.com/bmi/  Cholesterol- Have your cholesterol checked every year.  Diabetes- Have your blood sugar checked every year if you have high blood pressure, high cholesterol, a family history of diabetes or if you are overweight.  Pap Test - Have a pap test every 1 to 5 years if you have been sexually active.  If you are older than 65 and recent pap tests have been normal you may not need additional pap tests.  In addition, if you have had a hysterectomy  for benign disease additional pap tests are not necessary.  Mammogram-Yearly mammograms are essential for early detection of breast cancer  Screening for Colon Cancer- Colonoscopy starting at age 50. Screening may begin sooner depending on your family history and other health conditions.  Follow up colonoscopy  as directed by your Gastroenterologist.  Screening for Osteoporosis- Screening begins at age 65 with bone density scanning, sooner if you are at higher risk for developing Osteoporosis.  Get these medicines  Calcium with Vitamin D- Your body requires 1200-1500 mg of Calcium a day and 800-1000 IU of Vitamin D a day.  You can only absorb 500 mg of Calcium at a time therefore Calcium must be taken in 2 or 3 separate doses throughout the day.  Hormones- Hormone therapy has been associated with increased risk for certain cancers and heart disease.  Talk to your healthcare provider about if you need relief from menopausal symptoms.  Aspirin- Ask your healthcare provider about taking Aspirin to prevent Heart Disease and Stroke.  Get these Immuniztions  Flu shot- Every fall  Pneumonia shot- Once after the age of 65; if you are younger ask your healthcare provider if you need a pneumonia shot.  Tetanus- Every ten years.  Zostavax- Once after the age of 60 to prevent shingles.  Take these steps  Don't smoke- Your healthcare provider can help you quit. For tips on how to quit, ask your healthcare provider or go to www.smokefree.gov or call 1-800 QUIT-NOW.  Be physically active- Exercise 5 days a week for a minimum of 30 minutes.  If you are not already physically active, start slow and gradually work up to 30 minutes of moderate physical activity.  Try walking, dancing, bike riding, swimming, etc.  Eat a healthy diet- Eat a variety of healthy foods such as fruits, vegetables, whole   grains, low fat milk, low fat cheeses, yogurt, lean meats, chicken, fish, eggs, dried beans, tofu, etc.  For more information go to www.thenutritionsource.org  Dental visit- Brush and floss teeth twice daily; visit your dentist twice a year.  Eye exam- Visit your Optometrist or Ophthalmologist yearly.  Drink alcohol in moderation- Limit alcohol intake to one drink or less a day.  Never drink and  drive.  Depression- Your emotional health is as important as your physical health.  If you're feeling down or losing interest in things you normally enjoy, please talk to your healthcare provider.  Seat Belts- can save your life; always wear one  Smoke/Carbon Monoxide detectors- These detectors need to be installed on the appropriate level of your home.  Replace batteries at least once a year.  Violence- If anyone is threatening or hurting you, please tell your healthcare provider.  Living Will/ Health care power of attorney- Discuss with your healthcare provider and family. 

## 2015-11-26 ENCOUNTER — Telehealth: Payer: Self-pay | Admitting: Physician Assistant

## 2015-11-26 NOTE — Telephone Encounter (Signed)
Please call this patient.  The bone density test shows that her bone mass is a little worse than last time, but still within the "low bone mass" or "osteopenia" range. She should continue weight bearing exercise, vitamin D and calcium. Repeat this test in 2 years.

## 2015-11-29 NOTE — Telephone Encounter (Signed)
Please call this patient with my message, then document that it has been done.

## 2015-12-11 ENCOUNTER — Telehealth: Payer: Self-pay | Admitting: Emergency Medicine

## 2015-12-11 NOTE — Telephone Encounter (Signed)
Please see my previous notes here. Has the patient been contacted? There is no such documentation.

## 2015-12-11 NOTE — Telephone Encounter (Signed)
Pt given bone density test result last week. Pt unsure of the person.

## 2015-12-12 NOTE — Telephone Encounter (Signed)
Called the patient , and left message on AM with the results.

## 2015-12-14 NOTE — Telephone Encounter (Signed)
See other message. Patient has been contacted.

## 2016-01-04 DIAGNOSIS — H60333 Swimmer's ear, bilateral: Secondary | ICD-10-CM | POA: Insufficient documentation

## 2016-04-05 ENCOUNTER — Ambulatory Visit

## 2016-04-05 DIAGNOSIS — Z23 Encounter for immunization: Secondary | ICD-10-CM

## 2016-06-04 LAB — HM MAMMOGRAPHY

## 2016-07-07 DIAGNOSIS — IMO0002 Reserved for concepts with insufficient information to code with codable children: Secondary | ICD-10-CM

## 2016-07-07 DIAGNOSIS — D229 Melanocytic nevi, unspecified: Secondary | ICD-10-CM

## 2016-07-07 HISTORY — DX: Reserved for concepts with insufficient information to code with codable children: IMO0002

## 2016-07-07 HISTORY — DX: Melanocytic nevi, unspecified: D22.9

## 2016-08-04 ENCOUNTER — Ambulatory Visit (INDEPENDENT_AMBULATORY_CARE_PROVIDER_SITE_OTHER)

## 2016-08-04 ENCOUNTER — Ambulatory Visit (INDEPENDENT_AMBULATORY_CARE_PROVIDER_SITE_OTHER): Admitting: Family Medicine

## 2016-08-04 VITALS — BP 130/80 | HR 89 | Temp 98.1°F | Resp 18 | Ht 62.75 in | Wt 112.0 lb

## 2016-08-04 DIAGNOSIS — J069 Acute upper respiratory infection, unspecified: Secondary | ICD-10-CM

## 2016-08-04 MED ORDER — FLUTICASONE PROPIONATE 50 MCG/ACT NA SUSP: 2.0000 | Freq: Every day | NASAL | 6 refills | Status: DC

## 2016-08-04 NOTE — Progress Notes (Signed)
Chief Complaint  Patient presents with  . Cough  . Sore Throat    HPI   Upper Respiratory Infection: Patient complains of symptoms of a URI. Symptoms include cough and sore throat. Onset of symptoms was 4 days ago, slightly improved since that time. She also c/o congestion, headache described as sinus headache and sore throat for the past 4 days .  She reports fevers and chills. Tmax 100.1.  She reports that she has been wheezing on the left side. She is drinking plenty of fluids. Evaluation to date: none. Treatment to date: cough suppressants and aspirin.  She had the flu shot 9/17     Past Medical History:  Diagnosis Date  . Allergy   . Arthritis    Cervical DDD C4, C7; L foot  . Osteopenia 07/07/2009  . Psoriasis   . Thyroid disease 07/07/2002   Hypothyroidism    Current Outpatient Prescriptions  Medication Sig Dispense Refill  . ascorbic acid (VITAMIN C) 500 MG tablet Take 500 mg by mouth daily.    Marland Kitchen b complex vitamins tablet Take 1 tablet by mouth daily.    . calcipotriene (DOVONOX) 0.005 % cream   0  . Cholecalciferol (VITAMIN D3) 2000 UNITS TABS Take by mouth daily.    . clobetasol (OLUX) 0.05 % topical foam APPLY TO AFFECTED AREA UP TO 2 TIMES DAILY AS NEEDED  0  . clobetasol (TEMOVATE) 0.05 % external solution   0  . levothyroxine (SYNTHROID, LEVOTHROID) 50 MCG tablet Take 50 mcg by mouth daily.    . Secukinumab (COSENTYX 300 DOSE) 150 MG/ML SOSY Inject into the skin.    . Wheat Dextrin (BENEFIBER PO) Take by mouth.    . fluticasone (FLONASE) 50 MCG/ACT nasal spray Place 2 sprays into both nostrils daily. 16 g 6  . RESVERATROL PO Take 30 mg by mouth daily.     No current facility-administered medications for this visit.     Allergies:  Allergies  Allergen Reactions  . Penicillins Other (See Comments)    Not sure if she is allergic but it was in her chart    Past Surgical History:  Procedure Laterality Date  . COLONOSCOPY  09/04/2012   normal; [facility].  Repeat  in 10 years.  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 ovarian cyst removed Left 1977  . ovarian scar tissue removal  1987  . TONSILLECTOMY  1959    Social History   Social History  . Marital status: Married    Spouse name: N/A  . Number of children: 3  . Years of education: College   Occupational History  . Retired    Social History Main Topics  . Smoking status: Never Smoker  . Smokeless tobacco: Never Used  . Alcohol use 8.4 oz/week    14 Glasses of wine per week     Comment: wine 1-2/ 5x  . Drug use: No  . Sexual activity: Yes    Birth control/ protection: None   Other Topics Concern  . None   Social History Narrative   Marital status: married x 22 years; happily married; no abuse; widowed 1988.      Children: stepchildren 3; 8 grandchildren; 1 great grandchildren.      Lives: with husband.      Employment:  Unemployed. Homemaker.      Tobacco: none       Alcohol:  1-2 glasses of wine five days per week.  Drugs: none      Exercise:  Walking 2-3 days per week; swimming.  Yoga.        Seatbelt:  100%      Guns:  Asks for response not to be recorded in chart.    ROS  Objective: Vitals:   08/04/16 1201  BP: 130/80  Pulse: 89  Resp: 18  Temp: 98.1 F (36.7 C)  TempSrc: Oral  SpO2: 96%  Weight: 112 lb (50.8 kg)  Height: 5' 2.75" (1.594 m)    Physical Exam COMPARISON:  PA and lateral chest 05/11/2014.  FINDINGS: Surgical clips are again seen the right hilum with scarring in the right lung base. Lungs are otherwise clear. Heart size is normal. No pneumothorax or pleural effusion no acute bony abnormality.  IMPRESSION: No acute disease.   Electronically Signed   By: Inge Rise M.D.   On: 08/04/2016 12:43   Assessment and Plan Tyona was seen today for cough and sore throat.  Diagnoses and all orders for this visit:  Acute URI- advised to stop aspirin Discussed using tylenol sinus for the benefit of the  antihistamine, cough suppressant and tylenol cxr did not show pneumonia or acute process -     DG Chest 2 View -     fluticasone (FLONASE) 50 MCG/ACT nasal spray; Place 2 sprays into both nostrils daily.     Uncertain

## 2016-08-04 NOTE — Patient Instructions (Addendum)
   IF you received an x-ray today, you will receive an invoice from [facility]. Please contact [facility] at [phone] with questions or concerns regarding your invoice.   IF you received labwork today, you will receive an invoice from [facility]. Please contact [facility] at [phone] with questions or concerns regarding your invoice.   Our billing staff will not be able to assist you with questions regarding bills from these companies.  You will be contacted with the lab results as soon as they are available. The fastest way to get your results is to activate your My Chart account. Instructions are located on the last page of this paperwork. If you have not heard from us regarding the results in 2 weeks, please contact this office.      Upper Respiratory Infection, Adult Most upper respiratory infections (URIs) are a viral infection of the air passages leading to the lungs. A URI affects the nose, throat, and upper air passages. The most common type of URI is nasopharyngitis and is typically referred to as "the common cold." URIs run their course and usually go away on their own. Most of the time, a URI does not require medical attention, but sometimes a bacterial infection in the upper airways can follow a viral infection. This is called a secondary infection. Sinus and middle ear infections are common types of secondary upper respiratory infections. Bacterial pneumonia can also complicate a URI. A URI can worsen asthma and chronic obstructive pulmonary disease (COPD). Sometimes, these complications can require emergency medical care and may be life threatening. What are the causes? Almost all URIs are caused by viruses. A virus is a type of germ and can spread from one person to another. What increases the risk? You may be at risk for a URI if:  You smoke.  You have chronic heart or lung disease.  You have a weakened defense (immune) system.  You are very  young or very old.  You have nasal allergies or asthma.  You work in crowded or poorly ventilated areas.  You work in health care facilities or schools. What are the signs or symptoms? Symptoms typically develop 2-3 days after you come in contact with a cold virus. Most viral URIs last 7-10 days. However, viral URIs from the influenza virus (flu virus) can last 14-18 days and are typically more severe. Symptoms may include:  Runny or stuffy (congested) nose.  Sneezing.  Cough.  Sore throat.  Headache.  Fatigue.  Fever.  Loss of appetite.  Pain in your forehead, behind your eyes, and over your cheekbones (sinus pain).  Muscle aches. How is this diagnosed? Your health care provider may diagnose a URI by:  Physical exam.  Tests to check that your symptoms are not due to another condition such as:  Strep throat.  Sinusitis.  Pneumonia.  Asthma. How is this treated? A URI goes away on its own with time. It cannot be cured with medicines, but medicines may be prescribed or recommended to relieve symptoms. Medicines may help:  Reduce your fever.  Reduce your cough.  Relieve nasal congestion. Follow these instructions at home:  Take medicines only as directed by your health care provider.  Gargle warm saltwater or take cough drops to comfort your throat as directed by your health care provider.  Use a warm mist humidifier or inhale steam from a shower to increase air moisture. This may make it easier to breathe.  Drink enough fluid to keep your urine clear   or pale yellow.  Eat soups and other clear broths and maintain good nutrition.  Rest as needed.  Return to work when your temperature has returned to normal or as your health care provider advises. You may need to stay home longer to avoid infecting others. You can also use a face mask and careful hand washing to prevent spread of the virus.  Increase the usage of your inhaler if you have asthma.  Do not  use any tobacco products, including cigarettes, chewing tobacco, or electronic cigarettes. If you need help quitting, ask your health care provider. How is this prevented? The best way to protect yourself from getting a cold is to practice good hygiene.  Avoid oral or hand contact with people with cold symptoms.  Wash your hands often if contact occurs. There is no clear evidence that vitamin C, vitamin E, echinacea, or exercise reduces the chance of developing a cold. However, it is always recommended to get plenty of rest, exercise, and practice good nutrition. Contact a health care provider if:  You are getting worse rather than better.  Your symptoms are not controlled by medicine.  You have chills.  You have worsening shortness of breath.  You have brown or red mucus.  You have yellow or brown nasal discharge.  You have pain in your face, especially when you bend forward.  You have a fever.  You have swollen neck glands.  You have pain while swallowing.  You have white areas in the back of your throat. Get help right away if:  You have severe or persistent:  Headache.  Ear pain.  Sinus pain.  Chest pain.  You have chronic lung disease and any of the following:  Wheezing.  Prolonged cough.  Coughing up blood.  A change in your usual mucus.  You have a stiff neck.  You have changes in your:  Vision.  Hearing.  Thinking.  Mood. This information is not intended to replace advice given to you by your health care provider. Make sure you discuss any questions you have with your health care provider. Document Released: 12/17/2000 Document Revised: 02/24/2016 Document Reviewed: 09/28/2013 Elsevier Interactive Patient Education  2017 Elsevier Inc.  

## 2016-09-02 ENCOUNTER — Ambulatory Visit (INDEPENDENT_AMBULATORY_CARE_PROVIDER_SITE_OTHER): Admitting: Family Medicine

## 2016-09-02 VITALS — BP 132/80 | HR 74 | Temp 97.4°F | Resp 18 | Ht 62.75 in | Wt 110.4 lb

## 2016-09-02 DIAGNOSIS — J029 Acute pharyngitis, unspecified: Secondary | ICD-10-CM

## 2016-09-02 LAB — POCT RAPID STREP A (OFFICE): Rapid Strep A Screen: NEGATIVE

## 2016-09-02 MED ORDER — HYDROCODONE-HOMATROPINE 5-1.5 MG/5ML PO SYRP: 5.0000 mL | ORAL_SOLUTION | Freq: Three times a day (TID) | ORAL | 0 refills | Status: DC | PRN

## 2016-09-02 MED ORDER — CEFDINIR 300 MG PO CAPS: 600.0000 mg | ORAL_CAPSULE | Freq: Every day | ORAL | 0 refills | Status: DC

## 2016-09-02 NOTE — Progress Notes (Signed)
By signing my name below, I, Madison Yang, attest that this documentation has been prepared under the direction and in the presence of Delman Cheadle, MD.  Electronically Signed: Verlee Monte, Medical Scribe. 09/02/16. 9:01 AM.  Subjective:    Patient ID: Madison Yang, female    DOB: [birth year], 66 y.o.   MRN: [id number]  HPI Chief Complaint  Patient presents with  . Sore Throat    x 2 days   . Cough    HPI Comments: Madison Yang is a 66 y.o. female who presents to the [facility] complaining of sore throat and cough onset 2 days. She was in 1 month prior with URI. CXR was nl so pt was treated for viral illness with supportive care and flonase. Pt did get her flu shot this season.  Pt was improving from her illness a month ago but she still feels residual fatigue. 2 days ago, pt woke up with a tickle in her throat and her sxs developed into associated sxs of sore throat, pain with swallowing, cough, and sleep disturbance. Pt has been drinking, but she hasn't been eating much. Took tylenol, delsym, cough drop, and tsp of cough syrup with some relief of her sxs. Denies abdominal pain, diarrhea, constipation, trouble urinating, hematuria, dysuria, urinary frequency, and urinary urgency.  Patient Active Problem List   Diagnosis Date Noted  . 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 Psoriasis 10/30/2015  . DDD (degenerative disc disease), cervical 10/30/2015  . Osteopenia 02/05/2014  . Pure hypercholesterolemia 02/05/2014  . Hypothyroidism 02/05/2014   Past Medical History:  Diagnosis Date  . Allergy   . Arthritis    Cervical DDD C4, C7; L foot  . Osteopenia 07/07/2009  . Psoriasis   . Thyroid disease 07/07/2002   Hypothyroidism   Past Surgical History:  Procedure Laterality Date  . COLONOSCOPY  09/04/2012   normal; [facility].  Repeat in 10 years.  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 ovarian cyst removed Left 1977  . ovarian scar  tissue removal  1987  . TONSILLECTOMY  1959   Allergies  Allergen Reactions  . Penicillins Other (See Comments)    Not sure if she is allergic but it was in her chart   Prior to Admission medications   Medication Sig Start Date End Date Taking? Authorizing Provider  ascorbic acid (VITAMIN C) 500 MG tablet Take 500 mg by mouth daily.    Historical Provider, MD  b complex vitamins tablet Take 1 tablet by mouth daily.    Historical Provider, MD  calcipotriene (DOVONOX) 0.005 % cream  08/03/15   Historical Provider, MD  Cholecalciferol (VITAMIN D3) 2000 UNITS TABS Take by mouth daily.    Historical Provider, MD  clobetasol (OLUX) 0.05 % topical foam APPLY TO AFFECTED AREA UP TO 2 TIMES DAILY AS NEEDED 10/02/15   Historical Provider, MD  clobetasol (TEMOVATE) 0.05 % external solution  10/29/15   Historical Provider, MD  fluticasone (FLONASE) 50 MCG/ACT nasal spray Place 2 sprays into both nostrils daily. 08/04/16   Forrest Moron, MD  levothyroxine (SYNTHROID, LEVOTHROID) 50 MCG tablet Take 50 mcg by mouth daily.    Historical Provider, MD  RESVERATROL PO Take 30 mg by mouth daily.    Historical Provider, MD  Secukinumab (COSENTYX 300 DOSE) 150 MG/ML SOSY Inject into the skin.    Historical Provider, MD  Wheat Dextrin (BENEFIBER PO) Take by mouth.  Historical Provider, MD   Social History   Social History  . Marital status: Married    Spouse name: N/A  . Number of children: 3  . Years of education: College   Occupational History  . Retired    Social History Main Topics  . Smoking status: Never Smoker  . Smokeless tobacco: Never Used  . Alcohol use 8.4 oz/week    14 Glasses of wine per week     Comment: wine 1-2/ 5x  . Drug use: No  . Sexual activity: Yes    Birth control/ protection: None   Other Topics Concern  . Not on file   Social History Narrative   Marital status: married x 22 years; happily married; no abuse; widowed 1988.      Children: stepchildren 3; 8  grandchildren; 1 great grandchildren.      Lives: with husband.      Employment:  Unemployed. Homemaker.      Tobacco: none       Alcohol:  1-2 glasses of wine five days per week.       Drugs: none      Exercise:  Walking 2-3 days per week; swimming.  Yoga.        Seatbelt:  100%      Guns:  Asks for response not to be recorded in chart.   Review of Systems  Constitutional: Positive for fatigue.  HENT: Positive for sore throat. Negative for trouble swallowing.   Respiratory: Positive for cough.   Gastrointestinal: Negative for abdominal pain, constipation and diarrhea.  Genitourinary: Negative for difficulty urinating, dysuria, frequency, hematuria and urgency.  Psychiatric/Behavioral: Positive for sleep disturbance.    Objective:  Physical Exam  Constitutional: She is oriented to person, place, and time. She appears well-developed and well-nourished. No distress.  HENT:  Head: Normocephalic and atraumatic.  Right Ear: Hearing, tympanic membrane, external ear and ear canal normal.  Left Ear: Hearing, tympanic membrane, external ear and ear canal normal.  Nose: Nose normal.  Mouth/Throat: Posterior oropharyngeal edema and posterior oropharyngeal erythema present. No oropharyngeal exudate.  A slight bit of edema around the uvula  Eyes: Conjunctivae and EOM are normal. Pupils are equal, round, and reactive to light.  Cardiovascular: Normal rate, regular rhythm, S1 normal, S2 normal, normal heart sounds and intact distal pulses.  Exam reveals no gallop and no friction rub.   No murmur heard. Pulmonary/Chest: Effort normal and breath sounds normal. No respiratory distress. She has no wheezes. She has no rhonchi. She has no rales.  Lymphadenopathy:    She has cervical adenopathy (anterior, R>L).  Neurological: She is alert and oriented to person, place, and time.  Skin: Skin is warm and dry. No rash noted.  Psychiatric: Her behavior is normal.  Vitals reviewed.  BP 132/80 (BP  Location: Right Arm, Patient Position: Sitting, Cuff Size: Small)   Pulse 74   Temp 97.4 F (36.3 C) (Oral)   Resp 18   Ht 5' 2.75" (1.594 m)   Wt 110 lb 6.4 oz (50.1 kg)   LMP 03/04/2004   SpO2 100%   BMI 19.71 kg/m    Results for orders placed or performed in visit on 09/02/16  POCT rapid strep A  Result Value Ref Range   Rapid Strep A Screen Negative Negative   Assessment & Plan:   1. Acute pharyngitis, unspecified etiology     Orders Placed This Encounter  Procedures  . Culture, Group A Strep    Order Specific Question:  Source    Answer:   oropharynx  . POCT rapid strep A    Meds ordered this encounter  Medications  . selenium 50 MCG TABS tablet    Sig: Take 50 mcg by mouth daily.  . cefdinir (OMNICEF) 300 MG capsule    Sig: Take 2 capsules (600 mg total) by mouth daily.    Dispense:  14 capsule    Refill:  0  . HYDROcodone-homatropine (HYCODAN) 5-1.5 MG/5ML syrup    Sig: Take 5 mLs by mouth every 8 (eight) hours as needed for cough.    Dispense:  120 mL    Refill:  0    I personally performed the services described in this documentation, which was scribed in my presence. The recorded information has been reviewed and considered, and addended by me as needed.   Delman Cheadle, M.D.  Primary Care at [facility] [address] [place], [place] [address] [phone] phone [phone] fax  09/02/16 9:59 AM

## 2016-09-02 NOTE — Patient Instructions (Addendum)
     IF you received an x-ray today, you will receive an invoice from [facility]. Please contact [facility] at [phone] with questions or concerns regarding your invoice.   IF you received labwork today, you will receive an invoice from [facility]. Please contact [facility] at [phone] with questions or concerns regarding your invoice.   Our billing staff will not be able to assist you with questions regarding bills from these companies.  You will be contacted with the lab results as soon as they are available. The fastest way to get your results is to activate your My Chart account. Instructions are located on the last page of this paperwork. If you have not heard from us regarding the results in 2 weeks, please contact this office.    Pharyngitis Pharyngitis is redness, pain, and swelling (inflammation) of your pharynx. What are the causes? Pharyngitis is usually caused by infection. Most of the time, these infections are from viruses (viral) and are part of a cold. However, sometimes pharyngitis is caused by bacteria (bacterial). Pharyngitis can also be caused by allergies. Viral pharyngitis may be spread from person to person by coughing, sneezing, and personal items or utensils (cups, forks, spoons, toothbrushes). Bacterial pharyngitis may be spread from person to person by more intimate contact, such as kissing. What are the signs or symptoms? Symptoms of pharyngitis include:  Sore throat.  Tiredness (fatigue).  Low-grade fever.  Headache.  Joint pain and muscle aches.  Skin rashes.  Swollen lymph nodes.  Plaque-like film on throat or tonsils (often seen with bacterial pharyngitis).  How is this diagnosed? Your health care provider will ask you questions about your illness and your symptoms. Your medical history, along with a physical exam, is often all that is needed to diagnose pharyngitis. Sometimes, a rapid strep test is done. Other lab tests may  also be done, depending on the suspected cause. How is this treated? Viral pharyngitis will usually get better in 3-4 days without the use of medicine. Bacterial pharyngitis is treated with medicines that kill germs (antibiotics). Follow these instructions at home:  Drink enough water and fluids to keep your urine clear or pale yellow.  Only take over-the-counter or prescription medicines as directed by your health care provider: ? If you are prescribed antibiotics, make sure you finish them even if you start to feel better. ? Do not take aspirin.  Get lots of rest.  Gargle with 8 oz of salt water ( tsp of salt per 1 qt of water) as often as every 1-2 hours to soothe your throat.  Throat lozenges (if you are not at risk for choking) or sprays may be used to soothe your throat. Contact a health care provider if:  You have large, tender lumps in your neck.  You have a rash.  You cough up green, yellow-brown, or bloody spit. Get help right away if:  Your neck becomes stiff.  You drool or are unable to swallow liquids.  You vomit or are unable to keep medicines or liquids down.  You have severe pain that does not go away with the use of recommended medicines.  You have trouble breathing (not caused by a stuffy nose). This information is not intended to replace advice given to you by your health care provider. Make sure you discuss any questions you have with your health care provider. Document Released: 06/23/2005 Document Revised: 11/29/2015 Document Reviewed: 02/28/2013 Elsevier Interactive Patient Education  2017 [employer]vier Inc.  

## 2016-09-04 LAB — CULTURE, GROUP A STREP: STREP A CULTURE: NEGATIVE

## 2016-11-13 ENCOUNTER — Encounter: Payer: Self-pay | Admitting: Physician Assistant

## 2016-11-13 ENCOUNTER — Ambulatory Visit (INDEPENDENT_AMBULATORY_CARE_PROVIDER_SITE_OTHER): Admitting: Physician Assistant

## 2016-11-13 VITALS — BP 157/93 | HR 85 | Temp 97.5°F | Resp 18 | Ht 62.75 in | Wt 109.8 lb

## 2016-11-13 DIAGNOSIS — M5030 Other cervical disc degeneration, unspecified cervical region: Secondary | ICD-10-CM | POA: Diagnosis not present

## 2016-11-13 MED ORDER — CYCLOBENZAPRINE HCL 10 MG PO TABS: 5.0000 mg | ORAL_TABLET | Freq: Three times a day (TID) | ORAL | 0 refills | Status: DC | PRN

## 2016-11-13 NOTE — Progress Notes (Signed)
Patient ID: Madison Yang, female    DOB: 1951/04/04, 66 y.o.   MRN: [id number]  PCP: Wardell Honour, MD  Chief Complaint  Patient presents with  . Neck Pain    X 3 days- pt states that she think its pinch nerve due to pain in neck, shoulder, arm, elbow, hand all on left side    Subjective:   Presents for evaluation of LEFT sided posterior neck and shoulder/upper back pain. I last saw her 10/2015 for wellness visit. She has been seen twice since then for acute illnesses.  She has had this same pain previously, but always on the RIGHT. Typically occurs once every year or two. Comes in and is prescribed muscle relaxers, but doesn't think she's ever taken them. The pain usually resolves with rest and ibuprofen.  From C-Spine radiographs 02/2012: Multilevel disc space narrowing and endplate spur formation. Prevertebral soft tissues normal thickness. Scattered facet degenerative changes. Encroachment upon right C5-C6 and bilateral C6-C7 neural foramina by uncovertebral spurs.  She recalls a c-spine film from 2015, but we don't see in in the record. No injury or identified trigger. Ibuprofen (200 mg) isn't helping. Lying down with her head supported feels better. She has no associated chest pain/tightness, anterior neck or jaw pain, SOB, dizziness. No nausea. No weakness in the upper extremities.  She is getting ready to move her elderly parents, ages 16 and 85, her mother with dementia, into a retirement community. She has been travelling back and forth between [place] and [place] helping them prepare. She is quite worried, given her mother's dementia and likely unhappiness once she is moved.  She is also experiencing anxiety about her husband's memory loss. He mentioned it initially last fall, but has postponed evaluation on multiple occasions. She has an appointment with a therapist tomorrow to discuss how this is impacting her, which she hasn't told her husband, and is feeling guilty  about the secrecy. She also feels guilty that she hasn't "made him go" sooner.   Review of Systems As above.    Patient Active Problem List   Diagnosis Date Noted  . 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 Psoriasis 10/30/2015  . DDD (degenerative disc disease), cervical 10/30/2015  . Osteopenia 02/05/2014  . Pure hypercholesterolemia 02/05/2014  . Hypothyroidism 02/05/2014     Prior to Admission medications   Medication Sig Start Date End Date Taking? Authorizing Provider  ascorbic acid (VITAMIN C) 500 MG tablet Take 500 mg by mouth daily.   Yes [provider]  b complex vitamins tablet Take 1 tablet by mouth daily.   Yes [provider]  calcipotriene (DOVONOX) 0.005 % cream  08/03/15  Yes [provider]  Cholecalciferol (VITAMIN D3) 2000 UNITS TABS Take by mouth daily.   Yes [provider]  clobetasol (OLUX) 0.05 % topical foam APPLY TO AFFECTED AREA UP TO 2 TIMES DAILY AS NEEDED 10/02/15  Yes [provider]  clobetasol (TEMOVATE) 0.05 % external solution  10/29/15  Yes [provider]  levothyroxine (SYNTHROID, LEVOTHROID) 50 MCG tablet Take 50 mcg by mouth daily.   Yes [provider]  Secukinumab (COSENTYX 300 DOSE) 150 MG/ML SOSY Inject into the skin.   Yes [provider]  selenium 50 MCG TABS tablet Take 50 mcg by mouth daily.   Yes [provider]  Wheat Dextrin (BENEFIBER PO) Take by mouth.   Yes [provider]  cefdinir (OMNICEF) 300 MG capsule Take 2  capsules (600 mg total) by mouth daily. Patient not taking: Reported on 11/13/2016 09/02/16   Shawnee Knapp, MD  fluticasone [facility]) 50 MCG/ACT nasal spray Place 2 sprays into both nostrils daily. Patient not taking: Reported on 11/13/2016 08/04/16   Forrest Moron, MD  HYDROcodone-homatropine [facility]) 5-1.5 MG/5ML syrup Take 5 mLs by mouth every 8 (eight) hours as needed for cough. Patient not taking: Reported on  11/13/2016 09/02/16   Shawnee Knapp, MD  RESVERATROL PO Take 30 mg by mouth daily.    [provider]     Allergies  Allergen Reactions  . Penicillins Other (See Comments)    Not sure if she is allergic but it was in her chart       Objective:  Physical Exam  Constitutional: She is oriented to person, place, and time. She appears well-developed and well-nourished. She is active and cooperative. No distress.  BP (!) 157/93 (BP Location: Right Arm, Patient Position: Sitting, Cuff Size: Small)   Pulse 85   Temp 97.5 F (36.4 C) (Oral)   Resp 18   Ht 5' 2.75" (1.594 m)   Wt 109 lb 12.8 oz (49.8 kg)   LMP 03/04/2004   SpO2 100%   BMI 19.61 kg/m   HENT:  Head: Normocephalic and atraumatic.  Right Ear: Hearing normal.  Left Ear: Hearing normal.  Eyes: Conjunctivae are normal. No scleral icterus.  Neck: Normal range of motion. Neck supple. No thyromegaly present.  Cardiovascular: Normal rate, regular rhythm, normal heart sounds and intact distal pulses.   Pulses:      Radial pulses are 2+ on the right side, and 2+ on the left side.  Pulmonary/Chest: Effort normal and breath sounds normal.  Musculoskeletal:       Cervical back: She exhibits tenderness, pain and spasm (trapezius). She exhibits normal range of motion, no bony tenderness, no swelling, no edema, no deformity, no laceration and normal pulse.       Back:  Lymphadenopathy:       Head (right side): No tonsillar, no preauricular, no posterior auricular and no occipital adenopathy present.       Head (left side): No tonsillar, no preauricular, no posterior auricular and no occipital adenopathy present.    She has no cervical adenopathy.       Right: No supraclavicular adenopathy present.       Left: No supraclavicular adenopathy present.  Neurological: She is alert and oriented to person, place, and time. No sensory deficit.  Skin: Skin is warm, dry and intact. No rash noted. No cyanosis or erythema. Nails show no  clubbing.  Psychiatric: She has a normal mood and affect. Her speech is normal and behavior is normal.           Assessment & Plan:   Problem List Items Addressed This Visit    DDD (degenerative disc disease), cervical - Primary    Increase ibuprofen from 200 mg to 600 mg TID with meals. Add cyclobenzaprine 5-10 mg up to Q8 hours, or reserve for HS. Rest. Hydrate. Heating pad.      Relevant Medications   cyclobenzaprine (FLEXERIL) 10 MG tablet       Return if symptoms worsen or fail to improve.   Fara Chute, PA-C [facility] at [place]

## 2016-11-13 NOTE — Patient Instructions (Addendum)
Use a heating pad to the area.    IF you received an x-ray today, you will receive an invoice from [facility]. Please contact [facility] at [phone] with questions or concerns regarding your invoice.   IF you received labwork today, you will receive an invoice from [place]. Please contact [facility] at [phone] with questions or concerns regarding your invoice.   Our billing staff will not be able to assist you with questions regarding bills from these companies.  You will be contacted with the lab results as soon as they are available. The fastest way to get your results is to activate your My Chart account. Instructions are located on the last page of this paperwork. If you have not heard from [place] regarding the results in 2 weeks, please contact this office.

## 2016-11-13 NOTE — Assessment & Plan Note (Signed)
Increase ibuprofen from 200 mg to 600 mg TID with meals. Add cyclobenzaprine 5-10 mg up to Q8 hours, or reserve for HS. Rest. Hydrate. Heating pad.

## 2016-12-31 ENCOUNTER — Encounter: Admitting: Family Medicine

## 2017-02-17 ENCOUNTER — Ambulatory Visit (INDEPENDENT_AMBULATORY_CARE_PROVIDER_SITE_OTHER): Admitting: Family Medicine

## 2017-02-17 ENCOUNTER — Encounter: Payer: Self-pay | Admitting: Family Medicine

## 2017-02-17 VITALS — BP 131/82 | HR 69 | Temp 98.0°F | Resp 18 | Ht 62.21 in | Wt 108.0 lb

## 2017-02-17 DIAGNOSIS — Z131 Encounter for screening for diabetes mellitus: Secondary | ICD-10-CM

## 2017-02-17 DIAGNOSIS — M5030 Other cervical disc degeneration, unspecified cervical region: Secondary | ICD-10-CM

## 2017-02-17 DIAGNOSIS — Z820 Family history of epilepsy and other diseases of the nervous system: Secondary | ICD-10-CM | POA: Diagnosis not present

## 2017-02-17 DIAGNOSIS — E034 Atrophy of thyroid (acquired): Secondary | ICD-10-CM | POA: Diagnosis not present

## 2017-02-17 DIAGNOSIS — M8589 Other specified disorders of bone density and structure, multiple sites: Secondary | ICD-10-CM

## 2017-02-17 DIAGNOSIS — L409 Psoriasis, unspecified: Secondary | ICD-10-CM | POA: Diagnosis not present

## 2017-02-17 DIAGNOSIS — Z23 Encounter for immunization: Secondary | ICD-10-CM

## 2017-02-17 DIAGNOSIS — E7800 Pure hypercholesterolemia, unspecified: Secondary | ICD-10-CM | POA: Diagnosis not present

## 2017-02-17 DIAGNOSIS — Z0000 Encounter for general adult medical examination without abnormal findings: Secondary | ICD-10-CM | POA: Diagnosis not present

## 2017-02-17 LAB — POCT URINALYSIS DIP (MANUAL ENTRY)
Bilirubin, UA: NEGATIVE
Blood, UA: NEGATIVE
GLUCOSE UA: NEGATIVE mg/dL
Ketones, POC UA: NEGATIVE mg/dL
Nitrite, UA: NEGATIVE
PROTEIN UA: NEGATIVE mg/dL
SPEC GRAV UA: 1.015 (ref 1.010–1.025)
UROBILINOGEN UA: 0.2 U/dL
pH, UA: 7.5 (ref 5.0–8.0)

## 2017-02-17 NOTE — Progress Notes (Signed)
Subjective:    Patient ID: Madison Yang, female    DOB: [birth year], 66 y.o.   MRN: [id number]  02/17/2017  Annual Exam   HPI This 66 y.o. female presents for Complete Physical Examination.  Last physical:  10-2015 Pap smear:  2014; [facility]. Mammogram:  05/2016 Colonoscopy:  2014 Bone density:  2017 Eye exam:  Glasses; declining slowly; [facility] S[facility].  Dental exam:  Squamous cell carcinoma: forehead.  S/p resection.  Psoriasis: followed by dermatology.  Stoppoing Cosentix.  Topical will clear; clobetasol lotion/foam/solution.  Considered Humira yet more side effects.  Consider to stop.    Eyelid swelling: icing.  Hypothyroidism:  [place]; followed yearly.    Visual Acuity Screening   Right eye Left eye Both eyes  Without correction:     With correction: 20/20 20/20 20/20     BP Readings from Last 3 Encounters:  02/17/17 131/82  11/13/16 (!) 157/93  09/02/16 132/80   Wt Readings from Last 3 Encounters:  02/17/17 108 lb (49 kg)  11/13/16 109 lb 12.8 oz (49.8 kg)  09/02/16 110 lb 6.4 oz (50.1 kg)   Immunization History  Administered Date(s) Administered  . Influenza Whole 04/06/2012  . Influenza,inj,Quad PF,36+ Mos 04/26/2013, 03/29/2014, 04/17/2015, 04/05/2016  . Tdap 07/07/2008  . Zoster 05/07/2012  . Zoster Recombinat (Shingrix) 12/15/2016    Review of Systems  Constitutional: Negative for activity change, appetite change, chills, diaphoresis, fatigue, fever and unexpected weight change.  HENT: Negative for congestion, dental problem, drooling, ear discharge, ear pain, facial swelling, hearing loss, mouth sores, nosebleeds, postnasal drip, rhinorrhea, sinus pressure, sneezing, sore throat, tinnitus, trouble swallowing and voice change.   Eyes: Negative for photophobia, pain, discharge, redness, itching and visual disturbance.  Respiratory: Negative for apnea, cough, choking, chest tightness, shortness of breath, wheezing and stridor.   Cardiovascular: Negative for chest  pain, palpitations and leg swelling.  Gastrointestinal: Negative for abdominal distention, abdominal pain, anal bleeding, blood in stool, constipation, diarrhea, nausea, rectal pain and vomiting.  Endocrine: Negative for cold intolerance, heat intolerance, polydipsia, polyphagia and polyuria.  Genitourinary: Negative for decreased urine volume, difficulty urinating, dyspareunia, dysuria, enuresis, flank pain, frequency, genital sores, hematuria, menstrual problem, pelvic pain, urgency, vaginal bleeding, vaginal discharge and vaginal pain.       Nocturia x 0.  No urinary leakage.   Musculoskeletal: Negative for arthralgias, back pain, gait problem, joint swelling, myalgias, neck pain and neck stiffness.  Skin: Negative for color change, pallor, rash and wound.  Allergic/Immunologic: Negative for environmental allergies, food allergies and immunocompromised state.  Neurological: Negative for dizziness, tremors, seizures, syncope, facial asymmetry, speech difficulty, weakness, light-headedness, numbness and headaches.  Hematological: Negative for adenopathy. Does not bruise/bleed easily.  Psychiatric/Behavioral: Negative for agitation, behavioral problems, confusion, decreased concentration, dysphoric mood, hallucinations, self-injury, sleep disturbance and suicidal ideas. The patient is not nervous/anxious and is not hyperactive.        10pm; 640am.    Past Medical History:  Diagnosis Date  . Allergy   . Arthritis    Cervical DDD C4, C7; L foot  . Osteopenia 07/07/2009  . Psoriasis   . Thyroid disease 07/07/2002   Hypothyroidism   Past Surgical History:  Procedure Laterality Date  . COLONOSCOPY  09/04/2012   normal; [place].  Repeat in 10 years.  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 ovarian cyst removed Left 1977  . ovarian scar tissue removal  1987  . SKIN SURGERY     done Jan 2018 on  the forehead   . TONSILLECTOMY  1959   Allergies  Allergen Reactions  . Penicillins  Other (See Comments)    Not sure if she is allergic but it was in her chart   Current Outpatient Prescriptions  Medication Sig Dispense Refill  . ascorbic acid (VITAMIN C) 500 MG tablet Take 500 mg by mouth daily.    Marland Kitchen b complex vitamins tablet Take 1 tablet by mouth daily.    . B Complex-C (SUPER B COMPLEX PO) Take by mouth.    . calcium carbonate (CALCIUM 600) 600 MG TABS tablet Take 600 mg by mouth 2 (two) times daily with a meal.    . Cholecalciferol (VITAMIN D3) 2000 UNITS TABS Take by mouth daily.    . clobetasol (OLUX) 0.05 % topical foam APPLY TO AFFECTED AREA UP TO 2 TIMES DAILY AS NEEDED  0  . clobetasol (TEMOVATE) 0.05 % external solution   0  . levothyroxine (SYNTHROID, LEVOTHROID) 50 MCG tablet Take 50 mcg by mouth daily.    . Magnesium 250 MG TABS Take by mouth.    . Multiple Vitamins-Minerals (ICAPS AREDS 2 PO) Take by mouth.    . RESVERATROL PO Take 30 mg by mouth daily.    . Selenium 200 MCG CAPS Take by mouth.    . selenium 50 MCG TABS tablet Take 50 mcg by mouth daily.    . Wheat Dextrin (BENEFIBER DRINK MIX PO) Take by mouth.     No current facility-administered medications for this visit.    Social History   Social History  . Marital status: Married    Spouse name: N/A  . Number of children: 3  . Years of education: College   Occupational History  . Retired    Social History Main Topics  . Smoking status: Never Smoker  . Smokeless tobacco: Never Used  . Alcohol use 8.4 oz/week    14 Glasses of wine per week     Comment: wine 1-2/ 5x  . Drug use: No  . Sexual activity: Yes    Birth control/ protection: None   Other Topics Concern  . Not on file   Social History Narrative   Marital status: married x 22 years; happily married; no abuse; widowed 1988.      Children: stepchildren 3; 8 grandchildren; 1 great grandchildren.      Lives: with husband.      Employment:  Unemployed. Homemaker.      Tobacco: none       Alcohol:  1-2 glasses of wine five  days per week.       Drugs: none      Exercise:  Walking 2-3 days per week; swimming.  Yoga.        Seatbelt:  100%      Guns:  Asks for response not to be recorded in chart.   Family History  Problem Relation Age of Onset  . Hypertension Mother   . Hyperlipidemia Mother   . Dementia Mother   . Hyperlipidemia Father   . Hypertension Father   . Cancer Father 36       melanoma  . Cancer Maternal Grandmother   . Cancer Maternal Grandfather   . Cancer Paternal Grandmother        ovarian       Objective:    BP 131/82   Pulse 69   Temp 98 F (36.7 C) (Oral)   Resp 18   Ht 5' 2.21" (1.58 m)   Wt  108 lb (49 kg)   LMP 03/04/2004   SpO2 100%   BMI 19.62 kg/m  Physical Exam  Constitutional: She is oriented to person, place, and time. She appears well-developed and well-nourished. No distress.  HENT:  Head: Normocephalic and atraumatic.  Right Ear: External ear normal.  Left Ear: External ear normal.  Nose: Nose normal.  Mouth/Throat: Oropharynx is clear and moist.  Eyes: Pupils are equal, round, and reactive to light. Conjunctivae and EOM are normal.  Neck: Normal range of motion and full passive range of motion without pain. Neck supple. No JVD present. Carotid bruit is not present. No thyromegaly present.  Cardiovascular: Normal rate, regular rhythm and normal heart sounds.  Exam reveals no gallop and no friction rub.   No murmur heard. Pulmonary/Chest: Effort normal and breath sounds normal. She has no wheezes. She has no rales. Right breast exhibits no inverted nipple, no mass, no nipple discharge, no skin change and no tenderness. Left breast exhibits no inverted nipple, no mass, no nipple discharge, no skin change and no tenderness. Breasts are symmetrical.  Abdominal: Soft. Bowel sounds are normal. She exhibits no distension and no mass. There is no tenderness. There is no rebound and no guarding.  Musculoskeletal:       Right shoulder: Normal.       Left shoulder:  Normal.       Cervical back: Normal.  Lymphadenopathy:    She has no cervical adenopathy.  Neurological: She is alert and oriented to person, place, and time. She has normal reflexes. No cranial nerve deficit. She exhibits normal muscle tone. Coordination normal.  Skin: Skin is warm and dry. No rash noted. She is not diaphoretic. No erythema. No pallor.  Psychiatric: She has a normal mood and affect. Her behavior is normal. Judgment and thought content normal.  Nursing note and vitals reviewed.   No results found. Depression screen [facility] 2/9 02/17/2017 11/13/2016 09/02/2016 08/04/2016 10/30/2015  Decreased Interest 0 0 0 0 0  Down, Depressed, Hopeless 0 0 0 0 0  PHQ - 2 Score 0 0 0 0 0   Fall Risk  02/17/2017 11/13/2016 09/02/2016 08/04/2016 10/30/2015  Falls in the past year? No No No No No        Assessment & Plan:   1. Routine physical examination   2. Pure hypercholesterolemia   3. Osteopenia of multiple sites   4. Hypothyroidism due to acquired atrophy of thyroid   5. DDD (degenerative disc disease), cervical   6. Psoriasis   7. Screening for diabetes mellitus    -anticipatory guidance provided --- exercise, weight loss, safe driving practices, calcium 600mg  bid with vitamin D 800 IU daily. -obtain age appropriate screening labs and labs for chronic disease management.   Orders Placed This Encounter  Procedures  . CBC with Differential/Platelet  . Comprehensive metabolic panel    Order Specific Question:   Has the patient fasted?    Answer:   Yes  . Lipid panel    Order Specific Question:   Has the patient fasted?    Answer:   Yes  . T4, free  . TSH  . VITAMIN D 25 Hydroxy (Vit-D Deficiency, Fractures)  . Hemoglobin A1c  . POCT urinalysis dipstick   Meds ordered this encounter  Medications  . Multiple Vitamins-Minerals (ICAPS AREDS 2 PO)    Sig: Take by mouth.  . calcium carbonate (CALCIUM 600) 600 MG TABS tablet    Sig: Take 600 mg by mouth 2 (two)  times daily with a  meal.  . Magnesium 250 MG TABS    Sig: Take by mouth.  . Selenium 200 MCG CAPS    Sig: Take by mouth.  . B Complex-C (SUPER B COMPLEX PO)    Sig: Take by mouth.  . Wheat Dextrin (BENEFIBER DRINK MIX PO)    Sig: Take by mouth.    No Follow-up on file.   Sigifredo Pignato Elayne Guerin, M.D. Primary Care at [facility] previously [facility] [place] [address] [place], [place]  [address] [phone] phone [phone] fax

## 2017-02-17 NOTE — Patient Instructions (Addendum)
   IF you received an x-ray today, you will receive an invoice from [facility]. Please contact [facility] at [phone] with questions or concerns regarding your invoice.   IF you received labwork today, you will receive an invoice from [facility]. Please contact [facility] at [phone] with questions or concerns regarding your invoice.   Our billing staff will not be able to assist you with questions regarding bills from these companies.  You will be contacted with the lab results as soon as they are available. The fastest way to get your results is to activate your My Chart account. Instructions are located on the last page of this paperwork. If you have not heard from us regarding the results in 2 weeks, please contact this office.     prevent Preventive Care 40-64 Years, Female Preventive care refers to lifestyle choices and visits with your health care provider that can promote health and wellness. What does preventive care include?  A yearly physical exam. This is also called an annual well check.  Dental exams once or twice a year.  Routine eye exams. Ask your health care provider how often you should have your eyes checked.  Personal lifestyle choices, including:  Daily care of your teeth and gums.  Regular physical activity.  Eating a healthy diet.  Avoiding tobacco and drug use.  Limiting alcohol use.  Practicing safe sex.  Taking low-dose aspirin daily starting at age 50.  Taking vitamin and mineral supplements as recommended by your health care provider. What happens during an annual well check? The services and screenings done by your health care provider during your annual well check will depend on your age, overall health, lifestyle risk factors, and family history of disease. Counseling  Your health care provider may ask you questions about your:  Alcohol use.  Tobacco use.  Drug use.  Emotional well-being.  Home and  relationship well-being.  Sexual activity.  Eating habits.  Work and work environment.  Method of birth control.  Menstrual cycle.  Pregnancy history. Screening  You may have the following tests or measurements:  Height, weight, and BMI.  Blood pressure.  Lipid and cholesterol levels. These may be checked every 5 years, or more frequently if you are over 50 years old.  Skin check.  Lung cancer screening. You may have this screening every year starting at age 55 if you have a 30-pack-year history of smoking and currently smoke or have quit within the past 15 years.  Fecal occult blood test (FOBT) of the stool. You may have this test every year starting at age 50.  Flexible sigmoidoscopy or colonoscopy. You may have a sigmoidoscopy every 5 years or a colonoscopy every 10 years starting at age 50.  Hepatitis C blood test.  Hepatitis B blood test.  Sexually transmitted disease (STD) testing.  Diabetes screening. This is done by checking your blood sugar (glucose) after you have not eaten for a while (fasting). You may have this done every 1-3 years.  Mammogram. This may be done every 1-2 years. Talk to your health care provider about when you should start having regular mammograms. This may depend on whether you have a family history of breast cancer.  BRCA-related cancer screening. This may be done if you have a family history of breast, ovarian, tubal, or peritoneal cancers.  Pelvic exam and Pap test. This may be done every 3 years starting at age 21. Starting at age 30, this may be done every 5 years   you have a Pap test in combination with an HPV test.  Bone density scan. This is done to screen for osteoporosis. You may have this scan if you are at high risk for osteoporosis.  Discuss your test results, treatment options, and if necessary, the need for more tests with your health care provider. Vaccines Your health care provider may recommend certain vaccines, such  as:  Influenza vaccine. This is recommended every year.  Tetanus, diphtheria, and acellular pertussis (Tdap, Td) vaccine. You may need a Td booster every 10 years.  Varicella vaccine. You may need this if you have not been vaccinated.  Zoster vaccine. You may need this after age 52.  Measles, mumps, and rubella (MMR) vaccine. You may need at least one dose of MMR if you were born in 1957 or later. You may also need a second dose.  Pneumococcal 13-valent conjugate (PCV13) vaccine. You may need this if you have certain conditions and were not previously vaccinated.  Pneumococcal polysaccharide (PPSV23) vaccine. You may need one or two doses if you smoke cigarettes or if you have certain conditions.  Meningococcal vaccine. You may need this if you have certain conditions.  Hepatitis A vaccine. You may need this if you have certain conditions or if you travel or work in places where you may be exposed to hepatitis A.  Hepatitis B vaccine. You may need this if you have certain conditions or if you travel or work in places where you may be exposed to hepatitis B.  Haemophilus influenzae type b (Hib) vaccine. You may need this if you have certain conditions.  Talk to your health care provider about which screenings and vaccines you need and how often you need them. This information is not intended to replace advice given to you by your health care provider. Make sure you discuss any questions you have with your health care provider. Document Released: 07/20/2015 Document Revised: 03/12/2016 Document Reviewed: 04/24/2015 Elsevier Interactive Patient Education  2017 [employer].

## 2017-02-17 NOTE — Progress Notes (Signed)
   Subjective:    Patient ID: Madison Yang, female    DOB: 05/02/1951, 66 y.o.   MRN: [id number]  HPI    Review of Systems  Constitutional: Positive for appetite change and diaphoresis.  Eyes: Positive for redness.  Genitourinary: Positive for dyspareunia.       Objective:   Physical Exam        Assessment & Plan:

## 2017-02-18 LAB — COMPREHENSIVE METABOLIC PANEL
ALK PHOS: 47 IU/L (ref 39–117)
ALT: 18 IU/L (ref 0–32)
AST: 22 IU/L (ref 0–40)
Albumin/Globulin Ratio: 2.4 — ABNORMAL HIGH (ref 1.2–2.2)
Albumin: 4.6 g/dL (ref 3.6–4.8)
BUN/Creatinine Ratio: 14 (ref 12–28)
BUN: 9 mg/dL (ref 8–27)
Bilirubin Total: 0.5 mg/dL (ref 0.0–1.2)
CALCIUM: 9.8 mg/dL (ref 8.7–10.3)
CO2: 26 mmol/L (ref 20–29)
CREATININE: 0.64 mg/dL (ref 0.57–1.00)
Chloride: 99 mmol/L (ref 96–106)
GFR calc Af Amer: 109 mL/min/{1.73_m2} (ref >59)
GFR, EST NON AFRICAN AMERICAN: 95 mL/min/{1.73_m2} (ref >59)
GLOBULIN, TOTAL: 1.9 g/dL (ref 1.5–4.5)
Glucose: 82 mg/dL (ref 65–99)
Potassium: 4.7 mmol/L (ref 3.5–5.2)
SODIUM: 138 mmol/L (ref 134–144)
Total Protein: 6.5 g/dL (ref 6.0–8.5)

## 2017-02-18 LAB — CBC WITH DIFFERENTIAL/PLATELET
BASOS ABS: 0.1 10*3/uL (ref 0.0–0.2)
BASOS: 2 %
EOS (ABSOLUTE): 0.1 10*3/uL (ref 0.0–0.4)
EOS: 3 %
HEMATOCRIT: 41 % (ref 34.0–46.6)
Hemoglobin: 13.9 g/dL (ref 11.1–15.9)
IMMATURE GRANULOCYTES: 0 %
Immature Grans (Abs): 0 10*3/uL (ref 0.0–0.1)
Lymphocytes Absolute: 0.8 10*3/uL (ref 0.7–3.1)
Lymphs: 23 %
MCH: 31.8 pg (ref 26.6–33.0)
MCHC: 33.9 g/dL (ref 31.5–35.7)
MCV: 94 fL (ref 79–97)
MONOCYTES: 9 %
MONOS ABS: 0.3 10*3/uL (ref 0.1–0.9)
NEUTROS ABS: 2.2 10*3/uL (ref 1.4–7.0)
Neutrophils: 63 %
Platelets: 289 10*3/uL (ref 150–379)
RBC: 4.37 x10E6/uL (ref 3.77–5.28)
RDW: 14.5 % (ref 12.3–15.4)
WBC: 3.5 10*3/uL (ref 3.4–10.8)

## 2017-02-18 LAB — LIPID PANEL
CHOL/HDL RATIO: 2.2 ratio (ref 0.0–4.4)
CHOLESTEROL TOTAL: 267 mg/dL — AB (ref 100–199)
HDL: 120 mg/dL (ref >39)
LDL CALC: 135 mg/dL — AB (ref 0–99)
TRIGLYCERIDES: 60 mg/dL (ref 0–149)
VLDL Cholesterol Cal: 12 mg/dL (ref 5–40)

## 2017-02-18 LAB — T4, FREE: Free T4: 1.51 ng/dL (ref 0.82–1.77)

## 2017-02-18 LAB — HEMOGLOBIN A1C
Est. average glucose Bld gHb Est-mCnc: 94 mg/dL
Hgb A1c MFr Bld: 4.9 % (ref 4.8–5.6)

## 2017-02-18 LAB — VITAMIN D 25 HYDROXY (VIT D DEFICIENCY, FRACTURES): VIT D 25 HYDROXY: 29.5 ng/mL — AB (ref 30.0–100.0)

## 2017-02-18 LAB — TSH: TSH: 3.84 u[IU]/mL (ref 0.450–4.500)

## 2017-05-26 DIAGNOSIS — H60333 Swimmer's ear, bilateral: Secondary | ICD-10-CM | POA: Diagnosis not present

## 2017-07-15 DIAGNOSIS — Z1231 Encounter for screening mammogram for malignant neoplasm of breast: Secondary | ICD-10-CM | POA: Diagnosis not present

## 2017-07-15 LAB — HM MAMMOGRAPHY

## 2017-07-17 ENCOUNTER — Encounter: Payer: Self-pay | Admitting: Family Medicine

## 2017-07-23 DIAGNOSIS — D229 Melanocytic nevi, unspecified: Secondary | ICD-10-CM | POA: Diagnosis not present

## 2017-07-23 DIAGNOSIS — L814 Other melanin hyperpigmentation: Secondary | ICD-10-CM | POA: Diagnosis not present

## 2017-07-23 DIAGNOSIS — L400 Psoriasis vulgaris: Secondary | ICD-10-CM | POA: Diagnosis not present

## 2017-07-23 DIAGNOSIS — Z85828 Personal history of other malignant neoplasm of skin: Secondary | ICD-10-CM | POA: Diagnosis not present

## 2017-08-28 DIAGNOSIS — E038 Other specified hypothyroidism: Secondary | ICD-10-CM | POA: Diagnosis not present

## 2017-11-13 ENCOUNTER — Encounter: Payer: Self-pay | Admitting: Family Medicine

## 2017-11-13 DIAGNOSIS — M8589 Other specified disorders of bone density and structure, multiple sites: Secondary | ICD-10-CM | POA: Diagnosis not present

## 2017-12-01 ENCOUNTER — Encounter: Payer: Self-pay | Admitting: Family Medicine

## 2017-12-08 ENCOUNTER — Telehealth: Payer: Self-pay | Admitting: Family Medicine

## 2017-12-08 NOTE — Telephone Encounter (Signed)
Bone density results from [facility] show:  OSTEOPENIA with FRAX of 8% risk of major osteoporotic fracture and 1% for hip fracture.  Current guidelines recommend:  1. Calcium 600mg  twice daily. 2. Vitamin D 800 IU daily.  3.  Weight bearing exercise for 30 minutes daily.  4.  Repeat bone density scan in two years.  MyChart message sent to patient.

## 2018-02-01 DIAGNOSIS — L814 Other melanin hyperpigmentation: Secondary | ICD-10-CM | POA: Diagnosis not present

## 2018-02-01 DIAGNOSIS — Z85828 Personal history of other malignant neoplasm of skin: Secondary | ICD-10-CM | POA: Diagnosis not present

## 2018-02-01 DIAGNOSIS — L573 Poikiloderma of Civatte: Secondary | ICD-10-CM | POA: Diagnosis not present

## 2018-02-01 DIAGNOSIS — Z7189 Other specified counseling: Secondary | ICD-10-CM | POA: Diagnosis not present

## 2018-03-01 DIAGNOSIS — Z6379 Other stressful life events affecting family and household: Secondary | ICD-10-CM | POA: Diagnosis not present

## 2018-03-01 DIAGNOSIS — M8580 Other specified disorders of bone density and structure, unspecified site: Secondary | ICD-10-CM | POA: Diagnosis not present

## 2018-03-01 DIAGNOSIS — Z681 Body mass index (BMI) 19 or less, adult: Secondary | ICD-10-CM | POA: Diagnosis not present

## 2018-03-01 DIAGNOSIS — E038 Other specified hypothyroidism: Secondary | ICD-10-CM | POA: Diagnosis not present

## 2018-03-01 DIAGNOSIS — Z23 Encounter for immunization: Secondary | ICD-10-CM | POA: Diagnosis not present

## 2018-03-01 DIAGNOSIS — E7849 Other hyperlipidemia: Secondary | ICD-10-CM | POA: Diagnosis not present

## 2018-03-02 ENCOUNTER — Ambulatory Visit (INDEPENDENT_AMBULATORY_CARE_PROVIDER_SITE_OTHER): Payer: Medicare Other | Admitting: Physician Assistant

## 2018-03-02 ENCOUNTER — Other Ambulatory Visit: Payer: Self-pay

## 2018-03-02 ENCOUNTER — Encounter: Payer: Self-pay | Admitting: Physician Assistant

## 2018-03-02 VITALS — BP 122/70 | HR 78 | Temp 98.3°F | Resp 18 | Ht 62.21 in | Wt 109.8 lb

## 2018-03-02 DIAGNOSIS — Z01419 Encounter for gynecological examination (general) (routine) without abnormal findings: Secondary | ICD-10-CM

## 2018-03-02 DIAGNOSIS — N952 Postmenopausal atrophic vaginitis: Secondary | ICD-10-CM | POA: Diagnosis not present

## 2018-03-02 NOTE — Patient Instructions (Addendum)
   I will contact you with your lab results as soon as they are available.   If you have not heard from me in 2 weeks, please contact me.  The fastest way to get your results is to register for My Chart (see the instructions on this printout).   If you have lab work done today you will be contacted with your lab results within the next 2 weeks.  If you have not heard from [place] then please contact us. The fastest way to get your results is to register for My Chart.   IF you received an x-ray today, you will receive an invoice from [facility]. Please contact [facility] at [phone] with questions or concerns regarding your invoice.   IF you received labwork today, you will receive an invoice from [facility]. Please contact [facility] at [phone] with questions or concerns regarding your invoice.   Our billing staff will not be able to assist you with questions regarding bills from these companies.  You will be contacted with the lab results as soon as they are available. The fastest way to get your results is to activate your My Chart account. Instructions are located on the last page of this paperwork. If you have not heard from [place] regarding the results in 2 weeks, please contact this office.

## 2018-03-02 NOTE — Progress Notes (Signed)
Madison Yang  MRN: [id number] DOB: 04-Aug-1950  PCP: Patient, No Pcp Per  Chief Complaint  Patient presents with  . Gynecologic Exam    pap     Subjective:  Pt presents to clinic for pap smear.  She is having no problems but her would like another one done because cousins recently diagnosed with cervical cancer with pathology from hysterectomy- she would like to have one for screening.  She does have some vaginal dryness but uses olive oil and that takes care of her symptoms during sex which is when she has the symptoms of dryness.  Last pap smear 2014 - normal with neg HPV.  History is obtained by patient.  Review of Systems  Patient Active Problem List   Diagnosis Date Noted  . 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 Psoriasis 10/30/2015  . DDD (degenerative disc disease), cervical 10/30/2015  . Osteopenia 02/05/2014  . Pure hypercholesterolemia 02/05/2014  . Hypothyroidism 02/05/2014    Current Outpatient Medications on File Prior to Visit  Medication Sig Dispense Refill  . ascorbic acid (VITAMIN C) 500 MG tablet Take 500 mg by mouth daily.    Marland Kitchen b complex vitamins tablet Take 1 tablet by mouth daily.    . calcium carbonate (CALCIUM 600) 600 MG TABS tablet Take 600 mg by mouth 2 (two) times daily with a meal.    . Cholecalciferol (VITAMIN D3) 2000 UNITS TABS Take by mouth daily.    . clobetasol (OLUX) 0.05 % topical foam APPLY TO AFFECTED AREA UP TO 2 TIMES DAILY AS NEEDED  0  . clobetasol (TEMOVATE) 0.05 % external solution   0  . levothyroxine (SYNTHROID, LEVOTHROID) 50 MCG tablet Take 75 mcg by mouth daily.     . Magnesium 250 MG TABS Take by mouth.    . RESVERATROL PO Take 30 mg by mouth daily.    . Selenium 200 MCG CAPS Take by mouth.    . Wheat Dextrin (BENEFIBER DRINK MIX PO) Take by mouth.    . B Complex-C (SUPER B COMPLEX PO) Take by mouth.    . Multiple Vitamins-Minerals (ICAPS AREDS 2 PO) Take by mouth.    . selenium 50 MCG TABS tablet Take 50  mcg by mouth daily.     No current facility-administered medications on file prior to visit.     Allergies  Allergen Reactions  . Penicillins Other (See Comments)    Not sure if she is allergic but it was in her chart    Past Medical History:  Diagnosis Date  . Allergy   . Arthritis    Cervical DDD C4, C7; L foot  . 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 Osteopenia 07/07/2009  . Psoriasis   . Squamous cell carcinoma 07/07/2016  . Thyroid disease 07/07/2002   Hypothyroidism   Social History   Social History Narrative   Marital status: married x 25 years; happily married; no abuse; widowed 1988.      Children: stepchildren 3; 5 grandchildren; 1 great grandchildren.      Lives: with husband.      Employment:  Unemployed. Homemaker.      Tobacco: none       Alcohol:  1-2 glasses of wine five days per week.       Drugs: none      Exercise:  Walking 2-3 days per week.  Body sculpting.        Seatbelt:  100%  Guns:  Asks for response not to be recorded in chart.   Social History   Tobacco Use  . Smoking status: Never Smoker  . Smokeless tobacco: Never Used  Substance Use Topics  . Alcohol use: Yes    Alcohol/week: 14.0 standard drinks    Types: 14 Glasses of wine per week    Comment: wine 1-2/ 5x  . Drug use: No   family history includes Cancer in her maternal grandfather, maternal grandmother, and paternal grandmother; Cancer (age of onset: 62) in her father; Dementia in her mother; Hyperlipidemia in her father and mother; Hypertension in her father and mother.     Objective:  BP 122/70   Pulse 78   Temp 98.3 F (36.8 C) (Oral)   Resp 18   Ht 5' 2.21" (1.58 m)   Wt 109 lb 12.8 oz (49.8 kg)   LMP 03/04/2004   SpO2 100%   BMI 19.95 kg/m  Body mass index is 19.95 kg/m.  Wt Readings from Last 3 Encounters:  03/02/18 109 lb 12.8 oz (49.8 kg)  02/17/17 108 lb (49 kg)  11/13/16 109 lb 12.8 oz (49.8 kg)    Physical Exam  Constitutional: She is  oriented to person, place, and time. She appears well-developed and well-nourished.  HENT:  Head: Normocephalic and atraumatic.  Right Ear: Hearing and external ear normal.  Left Ear: Hearing and external ear normal.  Eyes: Conjunctivae are normal.  Neck: Normal range of motion.  Pulmonary/Chest: Effort normal.  Genitourinary: Uterus normal. Pelvic exam was performed with patient supine. There is no rash, tenderness, lesion or injury on the right labia. There is no rash, tenderness, lesion or injury on the left labia. Cervix exhibits motion tenderness and friability. Cervix exhibits no discharge. Right adnexum displays no mass, no tenderness and no fullness. Left adnexum displays no mass, no tenderness and no fullness. No bleeding in the vagina. Vaginal discharge (mucus) found.  Genitourinary Comments: Atrophic vaginal tissue  Neurological: She is alert and oriented to person, place, and time.  Skin: Skin is warm, dry and intact.  Psychiatric: She has a normal mood and affect. Her behavior is normal. Judgment and thought content normal.  Vitals reviewed.   Assessment and Plan :  Encounter for gynecological examination without abnormal finding - Plan: Pap IG and HPV (high risk) DNA detection  Vaginal atrophy - pt ok with current symptom relief - we did discuss if this starts to cause her problems vaginal estrogen could be used.  Patient verbalized to me that they understand the following: diagnosis, what is being done for them, what to expect and what should be done at home.  Their questions have been answered.  See after visit summary for patient specific instructions.  Windell Hummingbird PA-C  [facility] 03/02/2018 11:15 AM  Please note: Portions of this report may have been transcribed using dragon voice recognition software. Every effort was made to ensure accuracy; however, inadvertent computerized transcription errors may be present.

## 2018-03-05 LAB — PAP IG AND HPV HIGH-RISK
HPV, high-risk: NEGATIVE
PAP SMEAR COMMENT: 0

## 2018-03-10 DIAGNOSIS — F4323 Adjustment disorder with mixed anxiety and depressed mood: Secondary | ICD-10-CM | POA: Diagnosis not present

## 2018-03-15 DIAGNOSIS — L409 Psoriasis, unspecified: Secondary | ICD-10-CM | POA: Diagnosis not present

## 2018-03-15 DIAGNOSIS — L218 Other seborrheic dermatitis: Secondary | ICD-10-CM | POA: Diagnosis not present

## 2018-03-15 DIAGNOSIS — L400 Psoriasis vulgaris: Secondary | ICD-10-CM | POA: Diagnosis not present

## 2018-03-15 DIAGNOSIS — Z85828 Personal history of other malignant neoplasm of skin: Secondary | ICD-10-CM | POA: Diagnosis not present

## 2018-04-30 DIAGNOSIS — F4323 Adjustment disorder with mixed anxiety and depressed mood: Secondary | ICD-10-CM | POA: Diagnosis not present

## 2018-05-21 DIAGNOSIS — F4323 Adjustment disorder with mixed anxiety and depressed mood: Secondary | ICD-10-CM | POA: Diagnosis not present

## 2018-06-18 DIAGNOSIS — F439 Reaction to severe stress, unspecified: Secondary | ICD-10-CM | POA: Diagnosis not present

## 2018-07-16 DIAGNOSIS — Z1231 Encounter for screening mammogram for malignant neoplasm of breast: Secondary | ICD-10-CM | POA: Diagnosis not present

## 2018-08-05 DIAGNOSIS — D1801 Hemangioma of skin and subcutaneous tissue: Secondary | ICD-10-CM | POA: Diagnosis not present

## 2018-08-05 DIAGNOSIS — L821 Other seborrheic keratosis: Secondary | ICD-10-CM | POA: Diagnosis not present

## 2018-08-05 DIAGNOSIS — Z85828 Personal history of other malignant neoplasm of skin: Secondary | ICD-10-CM | POA: Diagnosis not present

## 2018-08-05 DIAGNOSIS — L218 Other seborrheic dermatitis: Secondary | ICD-10-CM | POA: Diagnosis not present

## 2018-08-13 DIAGNOSIS — L409 Psoriasis, unspecified: Secondary | ICD-10-CM | POA: Diagnosis not present

## 2018-08-13 DIAGNOSIS — Z23 Encounter for immunization: Secondary | ICD-10-CM | POA: Diagnosis not present

## 2018-08-13 DIAGNOSIS — E785 Hyperlipidemia, unspecified: Secondary | ICD-10-CM | POA: Diagnosis not present

## 2018-08-13 DIAGNOSIS — Z0000 Encounter for general adult medical examination without abnormal findings: Secondary | ICD-10-CM | POA: Diagnosis not present

## 2018-08-13 DIAGNOSIS — F439 Reaction to severe stress, unspecified: Secondary | ICD-10-CM | POA: Diagnosis not present

## 2018-08-13 DIAGNOSIS — E039 Hypothyroidism, unspecified: Secondary | ICD-10-CM | POA: Diagnosis not present

## 2018-08-13 DIAGNOSIS — R030 Elevated blood-pressure reading, without diagnosis of hypertension: Secondary | ICD-10-CM | POA: Diagnosis not present

## 2018-08-13 DIAGNOSIS — M810 Age-related osteoporosis without current pathological fracture: Secondary | ICD-10-CM | POA: Diagnosis not present

## 2018-09-01 DIAGNOSIS — M859 Disorder of bone density and structure, unspecified: Secondary | ICD-10-CM | POA: Diagnosis not present

## 2018-09-01 DIAGNOSIS — E038 Other specified hypothyroidism: Secondary | ICD-10-CM | POA: Diagnosis not present

## 2018-09-01 DIAGNOSIS — Z6820 Body mass index (BMI) 20.0-20.9, adult: Secondary | ICD-10-CM | POA: Diagnosis not present

## 2018-09-01 DIAGNOSIS — E7849 Other hyperlipidemia: Secondary | ICD-10-CM | POA: Diagnosis not present

## 2018-09-01 DIAGNOSIS — Z6379 Other stressful life events affecting family and household: Secondary | ICD-10-CM | POA: Diagnosis not present

## 2018-09-02 DIAGNOSIS — F439 Reaction to severe stress, unspecified: Secondary | ICD-10-CM | POA: Diagnosis not present

## 2018-11-06 DIAGNOSIS — S92354A Nondisplaced fracture of fifth metatarsal bone, right foot, initial encounter for closed fracture: Secondary | ICD-10-CM | POA: Diagnosis not present

## 2018-11-15 DIAGNOSIS — S92354D Nondisplaced fracture of fifth metatarsal bone, right foot, subsequent encounter for fracture with routine healing: Secondary | ICD-10-CM | POA: Diagnosis not present

## 2018-12-13 DIAGNOSIS — S92354D Nondisplaced fracture of fifth metatarsal bone, right foot, subsequent encounter for fracture with routine healing: Secondary | ICD-10-CM | POA: Diagnosis not present

## 2019-01-08 DIAGNOSIS — T148XXA Other injury of unspecified body region, initial encounter: Secondary | ICD-10-CM | POA: Diagnosis not present

## 2019-02-03 DIAGNOSIS — L814 Other melanin hyperpigmentation: Secondary | ICD-10-CM | POA: Diagnosis not present

## 2019-02-03 DIAGNOSIS — L218 Other seborrheic dermatitis: Secondary | ICD-10-CM | POA: Diagnosis not present

## 2019-02-03 DIAGNOSIS — Z85828 Personal history of other malignant neoplasm of skin: Secondary | ICD-10-CM | POA: Diagnosis not present

## 2019-02-03 DIAGNOSIS — L400 Psoriasis vulgaris: Secondary | ICD-10-CM | POA: Diagnosis not present

## 2019-03-08 DIAGNOSIS — Z23 Encounter for immunization: Secondary | ICD-10-CM | POA: Diagnosis not present

## 2019-06-29 DIAGNOSIS — Z20828 Contact with and (suspected) exposure to other viral communicable diseases: Secondary | ICD-10-CM | POA: Diagnosis not present

## 2019-07-29 DIAGNOSIS — Z1231 Encounter for screening mammogram for malignant neoplasm of breast: Secondary | ICD-10-CM | POA: Diagnosis not present

## 2019-08-04 ENCOUNTER — Ambulatory Visit

## 2019-08-11 ENCOUNTER — Ambulatory Visit: Payer: Medicare Other | Attending: Internal Medicine

## 2019-08-11 DIAGNOSIS — Z23 Encounter for immunization: Secondary | ICD-10-CM | POA: Insufficient documentation

## 2019-08-11 NOTE — Progress Notes (Signed)
   Covid-19 Vaccination Clinic  Name:  Madison Yang    MRN: [id number] DOB: 07-15-1950  08/11/2019  Ms. Madison Yang was observed post Covid-19 immunization for 15 minutes without incidence. She was provided with Vaccine Information Sheet and instruction to access the V-Safe system.   Ms. Madison Yang was instructed to call 911 with any severe reactions post vaccine: Marland Kitchen Difficulty breathing  . Swelling of your face and throat  . A fast heartbeat  . A bad rash all over your body  . Dizziness and weakness    Immunizations Administered    Name Date Dose VIS Date Route   Pfizer COVID-19 Vaccine 08/11/2019  8:43 AM 0.3 mL 06/17/2019 Intramuscular   Manufacturer: [employer]   Lot: [id number]   [place]: [id number]

## 2019-08-21 ENCOUNTER — Ambulatory Visit

## 2019-08-26 DIAGNOSIS — Z0000 Encounter for general adult medical examination without abnormal findings: Secondary | ICD-10-CM | POA: Diagnosis not present

## 2019-08-26 DIAGNOSIS — E039 Hypothyroidism, unspecified: Secondary | ICD-10-CM | POA: Diagnosis not present

## 2019-08-26 DIAGNOSIS — R202 Paresthesia of skin: Secondary | ICD-10-CM | POA: Diagnosis not present

## 2019-08-26 DIAGNOSIS — M8580 Other specified disorders of bone density and structure, unspecified site: Secondary | ICD-10-CM | POA: Diagnosis not present

## 2019-08-26 DIAGNOSIS — E785 Hyperlipidemia, unspecified: Secondary | ICD-10-CM | POA: Diagnosis not present

## 2019-09-05 ENCOUNTER — Ambulatory Visit: Payer: Medicare Other | Attending: Internal Medicine

## 2019-09-05 DIAGNOSIS — Z23 Encounter for immunization: Secondary | ICD-10-CM | POA: Insufficient documentation

## 2019-09-05 NOTE — Progress Notes (Signed)
   Covid-19 Vaccination Clinic  Name:  Madison Yang    MRN: [id number] DOB: 1950-09-07  09/05/2019  [name] was observed post Covid-19 immunization for 15 minutes without incidence. She was provided with Vaccine Information Sheet and instruction to access the V-Safe system.   [name] was instructed to call 911 with any severe reactions post vaccine: Marland Kitchen Difficulty breathing  . Swelling of your face and throat  . A fast heartbeat  . A bad rash all over your body  . Dizziness and weakness    Immunizations Administered    Name Date Dose VIS Date Route   Pfizer COVID-19 Vaccine 09/05/2019  1:25 PM 0.3 mL 06/17/2019 Intramuscular   Manufacturer: [place]   Lot: [id number]   [place]: [id number]

## 2019-09-30 DIAGNOSIS — M8580 Other specified disorders of bone density and structure, unspecified site: Secondary | ICD-10-CM | POA: Diagnosis not present

## 2019-09-30 DIAGNOSIS — R202 Paresthesia of skin: Secondary | ICD-10-CM | POA: Diagnosis not present

## 2019-09-30 DIAGNOSIS — E039 Hypothyroidism, unspecified: Secondary | ICD-10-CM | POA: Diagnosis not present

## 2019-09-30 DIAGNOSIS — Z0000 Encounter for general adult medical examination without abnormal findings: Secondary | ICD-10-CM | POA: Diagnosis not present

## 2019-09-30 DIAGNOSIS — E785 Hyperlipidemia, unspecified: Secondary | ICD-10-CM | POA: Diagnosis not present

## 2019-10-17 DIAGNOSIS — R030 Elevated blood-pressure reading, without diagnosis of hypertension: Secondary | ICD-10-CM | POA: Diagnosis not present

## 2019-10-19 DIAGNOSIS — M8589 Other specified disorders of bone density and structure, multiple sites: Secondary | ICD-10-CM | POA: Diagnosis not present

## 2019-12-06 DIAGNOSIS — E038 Other specified hypothyroidism: Secondary | ICD-10-CM | POA: Diagnosis not present

## 2019-12-06 DIAGNOSIS — E7849 Other hyperlipidemia: Secondary | ICD-10-CM | POA: Diagnosis not present

## 2019-12-06 DIAGNOSIS — M859 Disorder of bone density and structure, unspecified: Secondary | ICD-10-CM | POA: Diagnosis not present

## 2019-12-08 DIAGNOSIS — E038 Other specified hypothyroidism: Secondary | ICD-10-CM | POA: Diagnosis not present

## 2019-12-08 DIAGNOSIS — M859 Disorder of bone density and structure, unspecified: Secondary | ICD-10-CM | POA: Diagnosis not present

## 2019-12-08 DIAGNOSIS — E7849 Other hyperlipidemia: Secondary | ICD-10-CM | POA: Diagnosis not present

## 2020-02-22 DIAGNOSIS — L821 Other seborrheic keratosis: Secondary | ICD-10-CM | POA: Diagnosis not present

## 2020-02-22 DIAGNOSIS — L400 Psoriasis vulgaris: Secondary | ICD-10-CM | POA: Diagnosis not present

## 2020-02-22 DIAGNOSIS — Z85828 Personal history of other malignant neoplasm of skin: Secondary | ICD-10-CM | POA: Diagnosis not present

## 2020-02-22 DIAGNOSIS — L738 Other specified follicular disorders: Secondary | ICD-10-CM | POA: Diagnosis not present

## 2020-04-03 DIAGNOSIS — Z23 Encounter for immunization: Secondary | ICD-10-CM | POA: Diagnosis not present

## 2020-04-20 DIAGNOSIS — Z23 Encounter for immunization: Secondary | ICD-10-CM | POA: Diagnosis not present

## 2020-12-25 DIAGNOSIS — M8580 Other specified disorders of bone density and structure, unspecified site: Secondary | ICD-10-CM | POA: Diagnosis not present

## 2020-12-25 DIAGNOSIS — J439 Emphysema, unspecified: Secondary | ICD-10-CM | POA: Diagnosis not present

## 2020-12-25 DIAGNOSIS — E039 Hypothyroidism, unspecified: Secondary | ICD-10-CM | POA: Diagnosis not present

## 2020-12-25 DIAGNOSIS — E785 Hyperlipidemia, unspecified: Secondary | ICD-10-CM | POA: Diagnosis not present

## 2021-02-20 DIAGNOSIS — L218 Other seborrheic dermatitis: Secondary | ICD-10-CM | POA: Diagnosis not present

## 2021-02-20 DIAGNOSIS — Z7189 Other specified counseling: Secondary | ICD-10-CM | POA: Diagnosis not present

## 2021-02-20 DIAGNOSIS — Z872 Personal history of diseases of the skin and subcutaneous tissue: Secondary | ICD-10-CM | POA: Diagnosis not present

## 2021-02-20 DIAGNOSIS — Z85828 Personal history of other malignant neoplasm of skin: Secondary | ICD-10-CM | POA: Diagnosis not present

## 2021-03-27 DIAGNOSIS — Z23 Encounter for immunization: Secondary | ICD-10-CM | POA: Diagnosis not present

## 2021-04-24 DIAGNOSIS — Z23 Encounter for immunization: Secondary | ICD-10-CM | POA: Diagnosis not present

## 2021-07-05 DIAGNOSIS — Z20822 Contact with and (suspected) exposure to covid-19: Secondary | ICD-10-CM | POA: Diagnosis not present

## 2021-07-05 DIAGNOSIS — U071 COVID-19: Secondary | ICD-10-CM | POA: Diagnosis not present

## 2021-07-25 DIAGNOSIS — Z0000 Encounter for general adult medical examination without abnormal findings: Secondary | ICD-10-CM | POA: Diagnosis not present

## 2021-07-25 DIAGNOSIS — M8580 Other specified disorders of bone density and structure, unspecified site: Secondary | ICD-10-CM | POA: Diagnosis not present

## 2021-07-25 DIAGNOSIS — E785 Hyperlipidemia, unspecified: Secondary | ICD-10-CM | POA: Diagnosis not present

## 2021-07-25 DIAGNOSIS — E039 Hypothyroidism, unspecified: Secondary | ICD-10-CM | POA: Diagnosis not present

## 2021-07-26 ENCOUNTER — Other Ambulatory Visit (HOSPITAL_COMMUNITY)
Admission: RE | Admit: 2021-07-26 | Discharge: 2021-07-26 | Disposition: A | Discharge status: Home / Self Care | Payer: Medicare Other | Source: Ambulatory Visit | Attending: Family Medicine | Admitting: Family Medicine

## 2021-07-26 ENCOUNTER — Other Ambulatory Visit: Payer: Self-pay | Admitting: Family Medicine

## 2021-07-26 DIAGNOSIS — Z1151 Encounter for screening for human papillomavirus (HPV): Secondary | ICD-10-CM | POA: Diagnosis not present

## 2021-07-26 DIAGNOSIS — Z01411 Encounter for gynecological examination (general) (routine) with abnormal findings: Secondary | ICD-10-CM | POA: Diagnosis not present

## 2021-07-26 DIAGNOSIS — L409 Psoriasis, unspecified: Secondary | ICD-10-CM | POA: Diagnosis not present

## 2021-07-26 DIAGNOSIS — E785 Hyperlipidemia, unspecified: Secondary | ICD-10-CM | POA: Diagnosis not present

## 2021-07-26 DIAGNOSIS — Z124 Encounter for screening for malignant neoplasm of cervix: Secondary | ICD-10-CM | POA: Diagnosis not present

## 2021-07-26 DIAGNOSIS — Z0000 Encounter for general adult medical examination without abnormal findings: Secondary | ICD-10-CM | POA: Diagnosis not present

## 2021-07-26 DIAGNOSIS — E039 Hypothyroidism, unspecified: Secondary | ICD-10-CM | POA: Diagnosis not present

## 2021-07-26 DIAGNOSIS — M8580 Other specified disorders of bone density and structure, unspecified site: Secondary | ICD-10-CM | POA: Diagnosis not present

## 2021-07-30 ENCOUNTER — Other Ambulatory Visit (HOSPITAL_BASED_OUTPATIENT_CLINIC_OR_DEPARTMENT_OTHER): Payer: Self-pay | Admitting: Family Medicine

## 2021-07-30 DIAGNOSIS — E785 Hyperlipidemia, unspecified: Secondary | ICD-10-CM

## 2021-07-30 LAB — CYTOLOGY - PAP
Comment: NEGATIVE
Diagnosis: NEGATIVE
High risk HPV: NEGATIVE

## 2021-08-08 DIAGNOSIS — Z1231 Encounter for screening mammogram for malignant neoplasm of breast: Secondary | ICD-10-CM | POA: Diagnosis not present

## 2021-08-12 ENCOUNTER — Other Ambulatory Visit: Payer: Self-pay

## 2021-08-12 ENCOUNTER — Ambulatory Visit (HOSPITAL_BASED_OUTPATIENT_CLINIC_OR_DEPARTMENT_OTHER)
Admission: RE | Admit: 2021-08-12 | Discharge: 2021-08-12 | Disposition: A | Discharge status: Home / Self Care | Payer: Medicare Other | Source: Ambulatory Visit | Attending: Family Medicine | Admitting: Family Medicine

## 2021-08-12 DIAGNOSIS — E785 Hyperlipidemia, unspecified: Secondary | ICD-10-CM | POA: Insufficient documentation

## 2021-10-14 DIAGNOSIS — L570 Actinic keratosis: Secondary | ICD-10-CM | POA: Diagnosis not present

## 2021-10-14 DIAGNOSIS — D235 Other benign neoplasm of skin of trunk: Secondary | ICD-10-CM | POA: Diagnosis not present

## 2021-10-14 DIAGNOSIS — Z7189 Other specified counseling: Secondary | ICD-10-CM | POA: Diagnosis not present

## 2021-10-14 DIAGNOSIS — Z85828 Personal history of other malignant neoplasm of skin: Secondary | ICD-10-CM | POA: Diagnosis not present

## 2021-10-14 DIAGNOSIS — D485 Neoplasm of uncertain behavior of skin: Secondary | ICD-10-CM | POA: Diagnosis not present

## 2021-10-18 DIAGNOSIS — M8589 Other specified disorders of bone density and structure, multiple sites: Secondary | ICD-10-CM | POA: Diagnosis not present

## 2021-10-18 DIAGNOSIS — Z780 Asymptomatic menopausal state: Secondary | ICD-10-CM | POA: Diagnosis not present

## 2022-01-17 DIAGNOSIS — E039 Hypothyroidism, unspecified: Secondary | ICD-10-CM | POA: Diagnosis not present

## 2022-01-17 DIAGNOSIS — E785 Hyperlipidemia, unspecified: Secondary | ICD-10-CM | POA: Diagnosis not present

## 2022-01-17 DIAGNOSIS — J439 Emphysema, unspecified: Secondary | ICD-10-CM | POA: Diagnosis not present

## 2022-01-17 DIAGNOSIS — I700 Atherosclerosis of aorta: Secondary | ICD-10-CM | POA: Diagnosis not present

## 2022-01-17 DIAGNOSIS — Z0000 Encounter for general adult medical examination without abnormal findings: Secondary | ICD-10-CM | POA: Diagnosis not present

## 2022-01-17 DIAGNOSIS — M8589 Other specified disorders of bone density and structure, multiple sites: Secondary | ICD-10-CM | POA: Diagnosis not present

## 2022-03-14 IMAGING — CT CT CARDIAC CORONARY ARTERY CALCIUM SCORE
3 series · 14 of 20 positions shown, 16 images · non-contrast
Comparison: None.

Addendum:
CLINICAL DATA: Cardiovascular Disease Risk stratification

EXAM:
Coronary Calcium Score
TECHNIQUE: A gated, non-contrast computed tomography scan of the heart was
performed using 3mm slice thickness. Axial images were analyzed on a
dedicated workstation. Calcium scoring of the coronary arteries was
performed using the Agatston method.

[Series 2: ax lung · axial · 0.67mm/px · z∈[-29,+83]mm · 5 of 86 slices shown]
[im 15/86  lung]
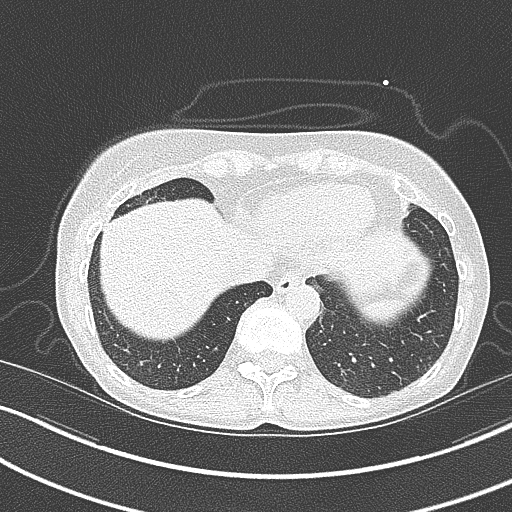
[im 29/86  lung]
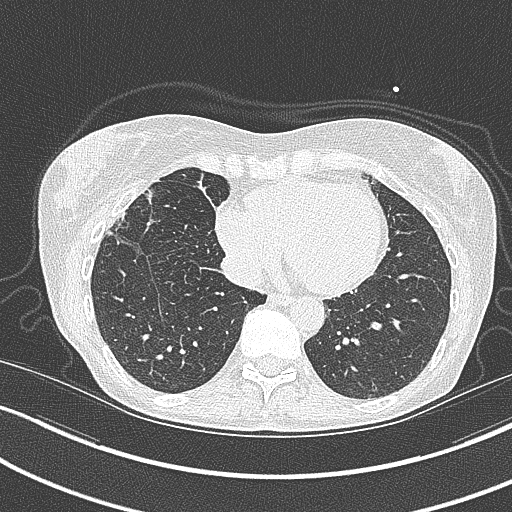
[im 43/86  lung]
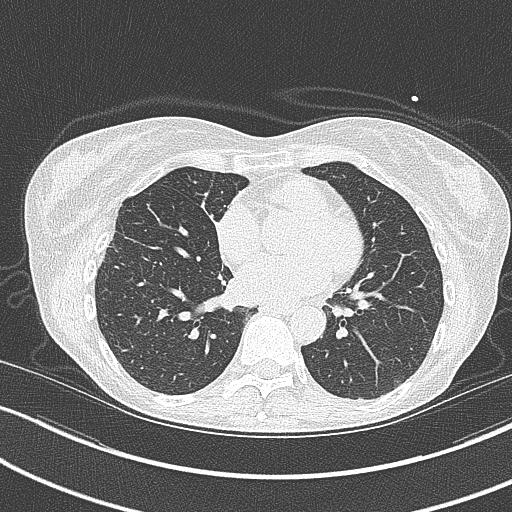
[im 57/86  lung]
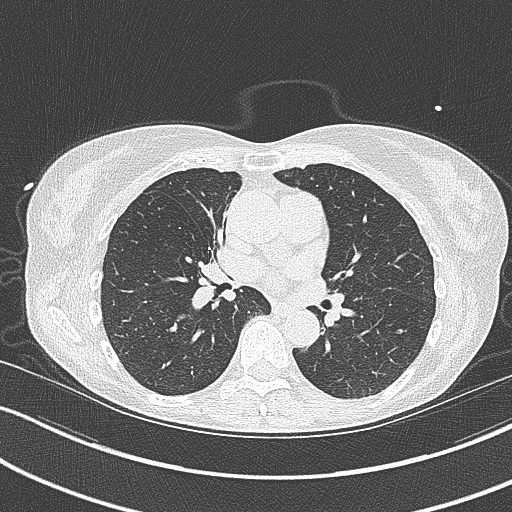
[im 71/86  lung]
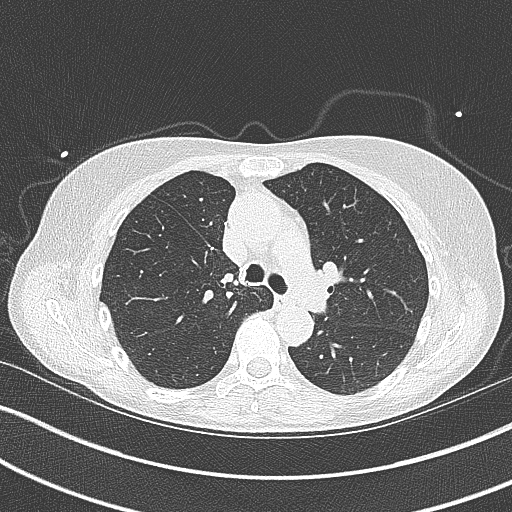

[Series 3: cascseq 3.0 sa36 70% (id) · axial · 0.39mm/px · z∈[-13,+71]mm · 3 of 57 slices shown]
[im 15/57  vessel]
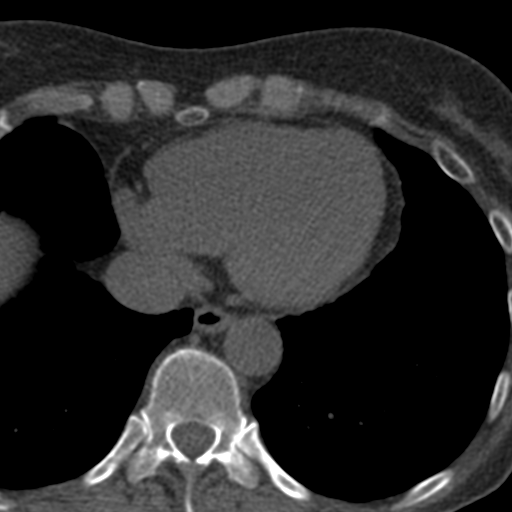
[im 29/57  vessel]
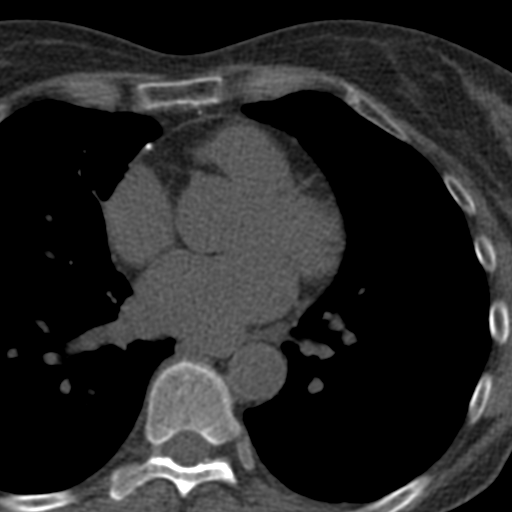
[im 43/57  vessel]
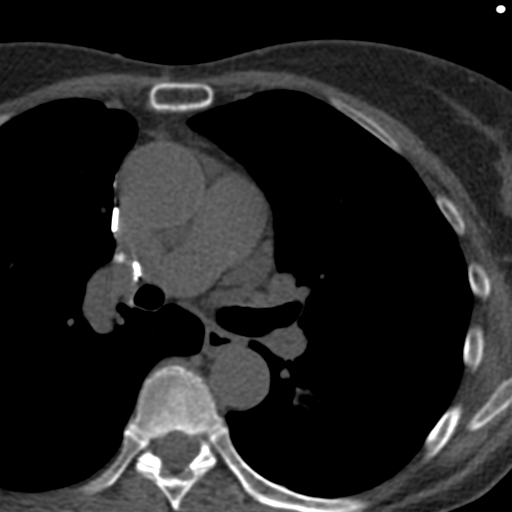

[Series 4: ax st · axial · 0.67mm/px · z∈[-33,+87]mm · 6 of 86 slices shown, 8 images]
[im 13/86  vessel]
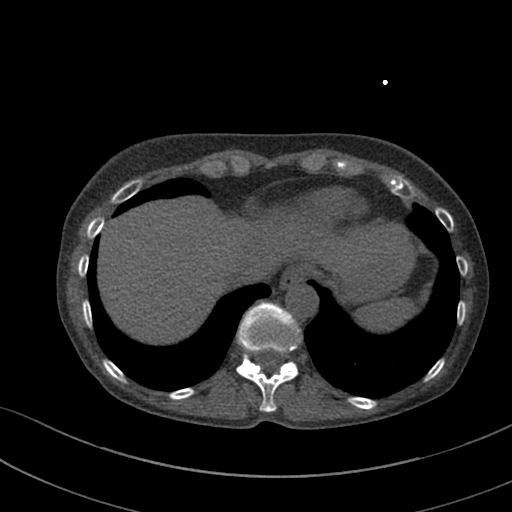
[im 13/86  lung]
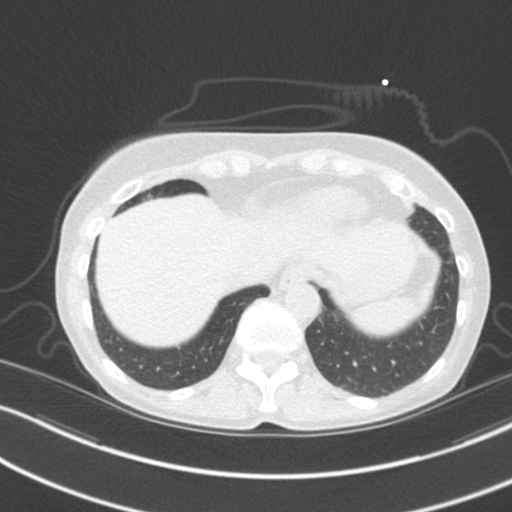
[im 25/86  vessel]
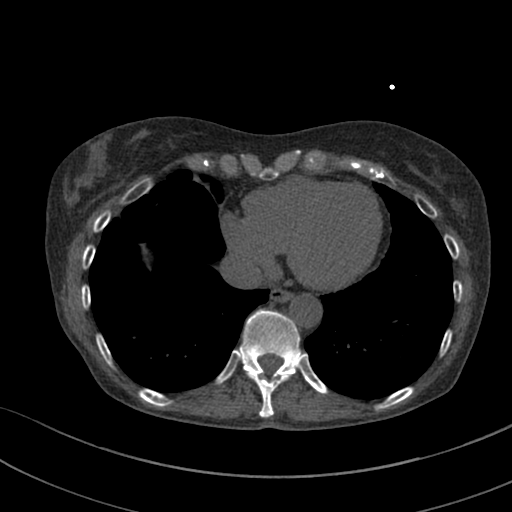
[im 37/86  vessel]
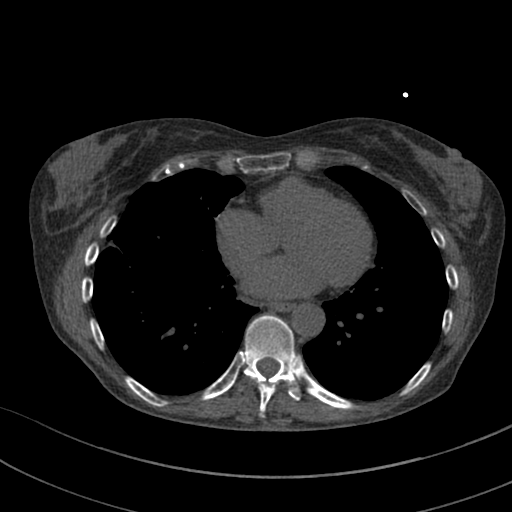
[im 49/86  vessel]
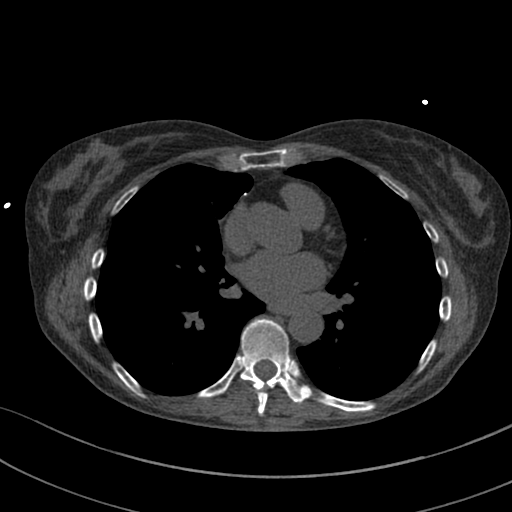
[im 61/86  vessel]
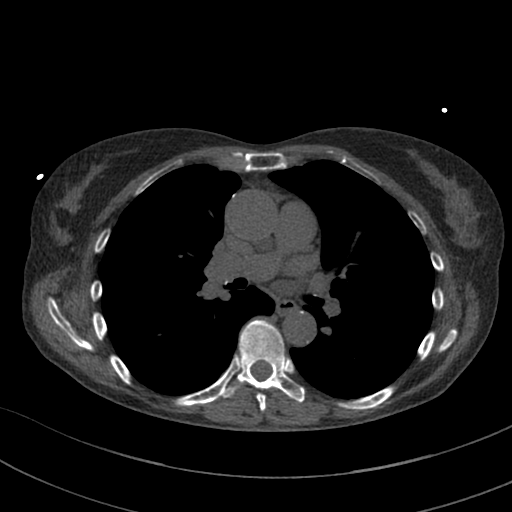
[im 61/86  lung]
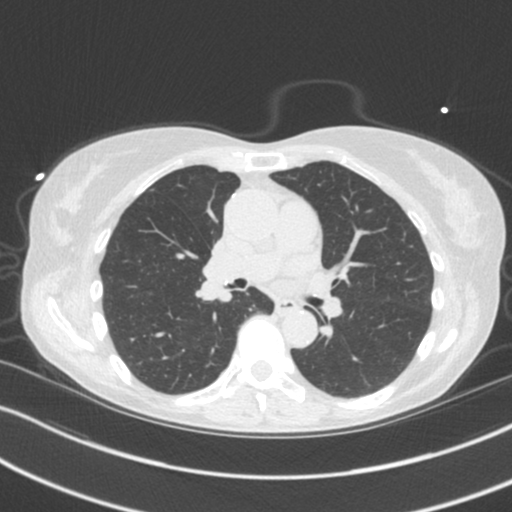
[im 73/86  vessel]
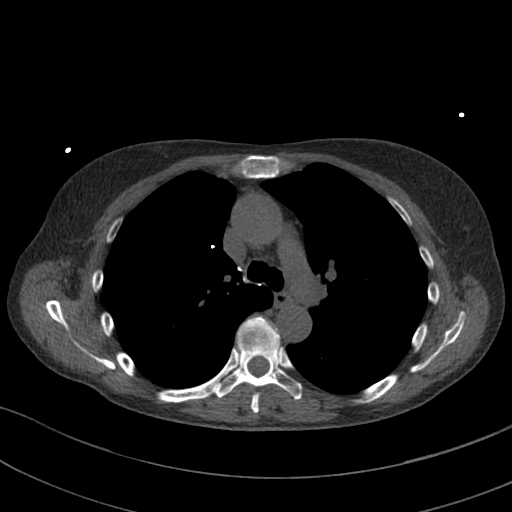

[14 of 20 positions shown; findings below may reference images not displayed]

FINDINGS: Coronary arteries: Normal origins.

Coronary Calcium Score:

Left main: 0

Left anterior descending artery: 0

Left circumflex artery: 0

Right coronary artery: 0

Total: 0

Percentile: 0

Pericardium: Normal.

Ascending Aorta: Normal caliber. Scattered calcifications in
descending thoracic aorta.

Non-cardiac: See separate report from [REDACTED].
IMPRESSION: Coronary calcium score of 0. This was 0 percentile for age-, race-,
and sex-matched controls.



If CAC=0, it is reasonable to withhold statin therapy and reassess
in 5 to 10 years, as long as higher risk conditions are absent
(diabetes mellitus, family history of premature CHD in first degree
relatives (males <55 years; females <65 years), cigarette smoking,
or LDL >=190 mg/dL).

If CAC is 1 to 99, it is reasonable to initiate statin therapy for
patients >=55 years of age.

If CAC is >=100 or >=75th percentile, it is reasonable to initiate
statin therapy at any age.

Cardiology referral should be considered for patients with CAC
scores >=400 or >=75th percentile.

*1644 AHA/ACC/AACVPR/AAPA/ABC/ELACHAB/IMBA/KUN SAKA/Libbie/MOLL/MOATSHE/HARIS EMILA ELISA
Guideline on the Management of Blood Cholesterol: A Report of the
American College of Cardiology/American Heart Association Task Force
on Clinical Practice Guidelines. J Am Coll Cardiol.
7878;73(24):3592-3429.

EXAM:
OVER-READ INTERPRETATION  CT CHEST

The following report is an over-read performed by radiologist Dr.
over-read does not include interpretation of cardiac or coronary
anatomy or pathology. The coronary artery calcium interpretation by
the cardiologist is attached.
FINDINGS: Cardiac: Normal heart size. No pericardial effusion. Normal caliber
thoracic aorta with mild atherosclerotic disease

Mediastinum: Esophagus is unremarkable. No pathologically enlarged
lymph nodes seen in the chest.

Lungs: Prior right upper lobectomy. Linear opacities of the
bilateral lower lungs, likely due to scarring or atelectasis. No
consolidation, pleural effusion or pneumothorax.

Musculoskeletal: No aggressive appearing osseous lesions.
IMPRESSION: No acute extracardiac findings.

*** End of Addendum ***
FINDINGS: Coronary arteries: Normal origins.

Coronary Calcium Score:

Left main: 0

Left anterior descending artery: 0

Left circumflex artery: 0

Right coronary artery: 0

Total: 0

Percentile: 0

Pericardium: Normal.

Ascending Aorta: Normal caliber. Scattered calcifications in
descending thoracic aorta.

Non-cardiac: See separate report from [REDACTED].
IMPRESSION: Coronary calcium score of 0. This was 0 percentile for age-, race-,
and sex-matched controls.



If CAC=0, it is reasonable to withhold statin therapy and reassess
in 5 to 10 years, as long as higher risk conditions are absent
(diabetes mellitus, family history of premature CHD in first degree
relatives (males <55 years; females <65 years), cigarette smoking,
or LDL >=190 mg/dL).

If CAC is 1 to 99, it is reasonable to initiate statin therapy for
patients >=55 years of age.

If CAC is >=100 or >=75th percentile, it is reasonable to initiate
statin therapy at any age.

Cardiology referral should be considered for patients with CAC
scores >=400 or >=75th percentile.

*1644 AHA/ACC/AACVPR/AAPA/ABC/ELACHAB/IMBA/KUN SAKA/Libbie/MOLL/MOATSHE/HARIS EMILA ELISA
Guideline on the Management of Blood Cholesterol: A Report of the
American College of Cardiology/American Heart Association Task Force
on Clinical Practice Guidelines. J Am Coll Cardiol.
7878;73(24):3592-3429.

## 2022-04-18 DIAGNOSIS — D485 Neoplasm of uncertain behavior of skin: Secondary | ICD-10-CM | POA: Diagnosis not present

## 2022-04-18 DIAGNOSIS — D225 Melanocytic nevi of trunk: Secondary | ICD-10-CM | POA: Diagnosis not present

## 2022-04-18 DIAGNOSIS — Z7189 Other specified counseling: Secondary | ICD-10-CM | POA: Diagnosis not present

## 2022-04-18 DIAGNOSIS — Z85828 Personal history of other malignant neoplasm of skin: Secondary | ICD-10-CM | POA: Diagnosis not present

## 2022-04-18 DIAGNOSIS — Z872 Personal history of diseases of the skin and subcutaneous tissue: Secondary | ICD-10-CM | POA: Diagnosis not present

## 2022-04-18 DIAGNOSIS — L570 Actinic keratosis: Secondary | ICD-10-CM | POA: Diagnosis not present

## 2022-04-18 DIAGNOSIS — L400 Psoriasis vulgaris: Secondary | ICD-10-CM | POA: Diagnosis not present

## 2022-05-09 DIAGNOSIS — L570 Actinic keratosis: Secondary | ICD-10-CM | POA: Diagnosis not present

## 2022-06-12 DIAGNOSIS — W260XXA Contact with knife, initial encounter: Secondary | ICD-10-CM | POA: Diagnosis not present

## 2022-06-12 DIAGNOSIS — S61412A Laceration without foreign body of left hand, initial encounter: Secondary | ICD-10-CM | POA: Diagnosis not present

## 2022-06-22 DIAGNOSIS — S61012A Laceration without foreign body of left thumb without damage to nail, initial encounter: Secondary | ICD-10-CM | POA: Diagnosis not present

## 2022-06-22 DIAGNOSIS — Z4802 Encounter for removal of sutures: Secondary | ICD-10-CM | POA: Diagnosis not present

## 2022-07-31 DIAGNOSIS — Z0000 Encounter for general adult medical examination without abnormal findings: Secondary | ICD-10-CM | POA: Diagnosis not present

## 2022-07-31 DIAGNOSIS — E039 Hypothyroidism, unspecified: Secondary | ICD-10-CM | POA: Diagnosis not present

## 2022-07-31 DIAGNOSIS — D485 Neoplasm of uncertain behavior of skin: Secondary | ICD-10-CM | POA: Diagnosis not present

## 2022-07-31 DIAGNOSIS — D239 Other benign neoplasm of skin, unspecified: Secondary | ICD-10-CM | POA: Diagnosis not present

## 2022-07-31 DIAGNOSIS — D225 Melanocytic nevi of trunk: Secondary | ICD-10-CM | POA: Diagnosis not present

## 2022-07-31 DIAGNOSIS — L400 Psoriasis vulgaris: Secondary | ICD-10-CM | POA: Diagnosis not present

## 2022-07-31 DIAGNOSIS — M8580 Other specified disorders of bone density and structure, unspecified site: Secondary | ICD-10-CM | POA: Diagnosis not present

## 2022-07-31 DIAGNOSIS — L821 Other seborrheic keratosis: Secondary | ICD-10-CM | POA: Diagnosis not present

## 2022-07-31 DIAGNOSIS — E785 Hyperlipidemia, unspecified: Secondary | ICD-10-CM | POA: Diagnosis not present

## 2022-07-31 DIAGNOSIS — L853 Xerosis cutis: Secondary | ICD-10-CM | POA: Diagnosis not present

## 2022-08-01 DIAGNOSIS — E039 Hypothyroidism, unspecified: Secondary | ICD-10-CM | POA: Diagnosis not present

## 2022-08-01 DIAGNOSIS — H9319 Tinnitus, unspecified ear: Secondary | ICD-10-CM | POA: Diagnosis not present

## 2022-08-01 DIAGNOSIS — M8580 Other specified disorders of bone density and structure, unspecified site: Secondary | ICD-10-CM | POA: Diagnosis not present

## 2022-08-01 DIAGNOSIS — E785 Hyperlipidemia, unspecified: Secondary | ICD-10-CM | POA: Diagnosis not present

## 2022-08-01 DIAGNOSIS — Z0000 Encounter for general adult medical examination without abnormal findings: Secondary | ICD-10-CM | POA: Diagnosis not present

## 2022-08-01 DIAGNOSIS — L409 Psoriasis, unspecified: Secondary | ICD-10-CM | POA: Diagnosis not present

## 2022-08-14 DIAGNOSIS — Z20822 Contact with and (suspected) exposure to covid-19: Secondary | ICD-10-CM | POA: Diagnosis not present

## 2022-08-14 DIAGNOSIS — R52 Pain, unspecified: Secondary | ICD-10-CM | POA: Diagnosis not present

## 2022-08-14 DIAGNOSIS — F439 Reaction to severe stress, unspecified: Secondary | ICD-10-CM | POA: Diagnosis not present

## 2022-08-14 DIAGNOSIS — Z03818 Encounter for observation for suspected exposure to other biological agents ruled out: Secondary | ICD-10-CM | POA: Diagnosis not present

## 2022-08-14 DIAGNOSIS — U071 COVID-19: Secondary | ICD-10-CM | POA: Diagnosis not present

## 2022-08-15 DIAGNOSIS — Z20822 Contact with and (suspected) exposure to covid-19: Secondary | ICD-10-CM | POA: Diagnosis not present

## 2022-08-15 DIAGNOSIS — Z03818 Encounter for observation for suspected exposure to other biological agents ruled out: Secondary | ICD-10-CM | POA: Diagnosis not present

## 2022-08-21 DIAGNOSIS — H9319 Tinnitus, unspecified ear: Secondary | ICD-10-CM | POA: Diagnosis not present

## 2022-08-21 DIAGNOSIS — L309 Dermatitis, unspecified: Secondary | ICD-10-CM | POA: Diagnosis not present

## 2022-10-20 DIAGNOSIS — H838X3 Other specified diseases of inner ear, bilateral: Secondary | ICD-10-CM | POA: Diagnosis not present

## 2022-10-20 DIAGNOSIS — H9312 Tinnitus, left ear: Secondary | ICD-10-CM | POA: Diagnosis not present

## 2022-10-20 DIAGNOSIS — H903 Sensorineural hearing loss, bilateral: Secondary | ICD-10-CM | POA: Diagnosis not present

## 2022-10-21 ENCOUNTER — Other Ambulatory Visit: Payer: Self-pay | Admitting: Otolaryngology

## 2022-10-21 DIAGNOSIS — H93A2 Pulsatile tinnitus, left ear: Secondary | ICD-10-CM

## 2022-11-14 DIAGNOSIS — D229 Melanocytic nevi, unspecified: Secondary | ICD-10-CM | POA: Diagnosis not present

## 2022-11-14 DIAGNOSIS — L814 Other melanin hyperpigmentation: Secondary | ICD-10-CM | POA: Diagnosis not present

## 2022-11-14 DIAGNOSIS — D239 Other benign neoplasm of skin, unspecified: Secondary | ICD-10-CM | POA: Diagnosis not present

## 2022-11-14 DIAGNOSIS — L821 Other seborrheic keratosis: Secondary | ICD-10-CM | POA: Diagnosis not present

## 2022-12-05 ENCOUNTER — Ambulatory Visit
Admission: RE | Admit: 2022-12-05 | Discharge: 2022-12-05 | Disposition: A | Discharge status: Home / Self Care | Payer: Medicare Other | Source: Ambulatory Visit | Attending: Otolaryngology | Admitting: Otolaryngology

## 2022-12-05 DIAGNOSIS — H93A2 Pulsatile tinnitus, left ear: Secondary | ICD-10-CM | POA: Diagnosis not present

## 2022-12-05 MED ORDER — GADOPICLENOL 0.5 MMOL/ML IV SOLN
5.0000 mL | Freq: Once | INTRAVENOUS | Status: AC | PRN
  Administered 2022-12-05: 5 mL via INTRAVENOUS

## 2022-12-08 ENCOUNTER — Other Ambulatory Visit: Payer: Self-pay | Admitting: Otolaryngology

## 2022-12-08 DIAGNOSIS — H93A9 Pulsatile tinnitus, unspecified ear: Secondary | ICD-10-CM

## 2023-01-06 ENCOUNTER — Other Ambulatory Visit: Payer: TRICARE For Life (TFL)

## 2023-01-09 ENCOUNTER — Ambulatory Visit
Admission: RE | Admit: 2023-01-09 | Discharge: 2023-01-09 | Disposition: A | Discharge status: Home / Self Care | Payer: Medicare Other | Source: Ambulatory Visit | Attending: Otolaryngology | Admitting: Otolaryngology

## 2023-01-09 DIAGNOSIS — H93A9 Pulsatile tinnitus, unspecified ear: Secondary | ICD-10-CM

## 2023-01-09 MED ORDER — IOPAMIDOL (ISOVUE-370) INJECTION 76%
75.0000 mL | Freq: Once | INTRAVENOUS | Status: AC | PRN
  Administered 2023-01-09: 75 mL via INTRAVENOUS

## 2023-01-19 DIAGNOSIS — L439 Lichen planus, unspecified: Secondary | ICD-10-CM | POA: Diagnosis not present

## 2023-02-13 DIAGNOSIS — I700 Atherosclerosis of aorta: Secondary | ICD-10-CM | POA: Diagnosis not present

## 2023-02-13 DIAGNOSIS — E785 Hyperlipidemia, unspecified: Secondary | ICD-10-CM | POA: Diagnosis not present

## 2023-02-13 DIAGNOSIS — J439 Emphysema, unspecified: Secondary | ICD-10-CM | POA: Diagnosis not present

## 2023-02-13 DIAGNOSIS — M8580 Other specified disorders of bone density and structure, unspecified site: Secondary | ICD-10-CM | POA: Diagnosis not present

## 2023-02-13 DIAGNOSIS — E039 Hypothyroidism, unspecified: Secondary | ICD-10-CM | POA: Diagnosis not present

## 2023-03-17 DIAGNOSIS — Z23 Encounter for immunization: Secondary | ICD-10-CM | POA: Diagnosis not present

## 2023-04-29 DIAGNOSIS — D239 Other benign neoplasm of skin, unspecified: Secondary | ICD-10-CM | POA: Diagnosis not present

## 2023-04-29 DIAGNOSIS — L814 Other melanin hyperpigmentation: Secondary | ICD-10-CM | POA: Diagnosis not present

## 2023-04-29 DIAGNOSIS — D229 Melanocytic nevi, unspecified: Secondary | ICD-10-CM | POA: Diagnosis not present

## 2023-04-29 DIAGNOSIS — L309 Dermatitis, unspecified: Secondary | ICD-10-CM | POA: Diagnosis not present

## 2023-04-29 DIAGNOSIS — L821 Other seborrheic keratosis: Secondary | ICD-10-CM | POA: Diagnosis not present

## 2023-04-29 DIAGNOSIS — D485 Neoplasm of uncertain behavior of skin: Secondary | ICD-10-CM | POA: Diagnosis not present

## 2023-05-20 ENCOUNTER — Encounter: Payer: Self-pay | Admitting: Internal Medicine

## 2023-08-03 ENCOUNTER — Ambulatory Visit (AMBULATORY_SURGERY_CENTER): Payer: Medicare Other

## 2023-08-03 VITALS — Ht 62.0 in | Wt 117.0 lb

## 2023-08-03 DIAGNOSIS — Z1211 Encounter for screening for malignant neoplasm of colon: Secondary | ICD-10-CM

## 2023-08-03 MED ORDER — SUFLAVE 178.7 G PO SOLR
1.0000 | ORAL | 0 refills | Status: AC
Start: 2023-08-03 — End: ?

## 2023-08-03 NOTE — Progress Notes (Signed)

## 2023-08-13 DIAGNOSIS — E039 Hypothyroidism, unspecified: Secondary | ICD-10-CM | POA: Diagnosis not present

## 2023-08-13 DIAGNOSIS — D72819 Decreased white blood cell count, unspecified: Secondary | ICD-10-CM | POA: Diagnosis not present

## 2023-08-13 DIAGNOSIS — E785 Hyperlipidemia, unspecified: Secondary | ICD-10-CM | POA: Diagnosis not present

## 2023-08-13 DIAGNOSIS — Z0000 Encounter for general adult medical examination without abnormal findings: Secondary | ICD-10-CM | POA: Diagnosis not present

## 2023-08-13 DIAGNOSIS — M8580 Other specified disorders of bone density and structure, unspecified site: Secondary | ICD-10-CM | POA: Diagnosis not present

## 2023-08-14 ENCOUNTER — Other Ambulatory Visit (HOSPITAL_COMMUNITY): Payer: Self-pay | Admitting: Family Medicine

## 2023-08-14 DIAGNOSIS — M8580 Other specified disorders of bone density and structure, unspecified site: Secondary | ICD-10-CM | POA: Diagnosis not present

## 2023-08-14 DIAGNOSIS — Z0000 Encounter for general adult medical examination without abnormal findings: Secondary | ICD-10-CM | POA: Diagnosis not present

## 2023-08-14 DIAGNOSIS — E785 Hyperlipidemia, unspecified: Secondary | ICD-10-CM | POA: Diagnosis not present

## 2023-08-14 DIAGNOSIS — E039 Hypothyroidism, unspecified: Secondary | ICD-10-CM | POA: Diagnosis not present

## 2023-08-14 DIAGNOSIS — L409 Psoriasis, unspecified: Secondary | ICD-10-CM | POA: Diagnosis not present

## 2023-08-14 DIAGNOSIS — G939 Disorder of brain, unspecified: Secondary | ICD-10-CM | POA: Diagnosis not present

## 2023-08-14 DIAGNOSIS — H9319 Tinnitus, unspecified ear: Secondary | ICD-10-CM | POA: Diagnosis not present

## 2023-08-15 ENCOUNTER — Ambulatory Visit (HOSPITAL_COMMUNITY)
Admission: RE | Admit: 2023-08-15 | Discharge: 2023-08-15 | Disposition: A | Discharge status: Home / Self Care | Payer: Medicare Other | Source: Ambulatory Visit | Attending: Family Medicine | Admitting: Family Medicine

## 2023-08-15 DIAGNOSIS — G939 Disorder of brain, unspecified: Secondary | ICD-10-CM | POA: Diagnosis not present

## 2023-08-15 DIAGNOSIS — G9389 Other specified disorders of brain: Secondary | ICD-10-CM | POA: Diagnosis not present

## 2023-08-15 MED ORDER — GADOBUTROL 1 MMOL/ML IV SOLN
5.0000 mL | Freq: Once | INTRAVENOUS | Status: AC | PRN
  Administered 2023-08-15: 5 mL via INTRAVENOUS

## 2023-08-30 ENCOUNTER — Encounter: Payer: Self-pay | Admitting: Certified Registered Nurse Anesthetist

## 2023-09-02 ENCOUNTER — Encounter: Payer: Self-pay | Admitting: Gastroenterology

## 2023-09-02 ENCOUNTER — Ambulatory Visit: Payer: Medicare Other | Admitting: Gastroenterology

## 2023-09-02 VITALS — BP 119/60 | HR 61 | Temp 97.8°F | Resp 14 | Ht 62.0 in | Wt 117.0 lb

## 2023-09-02 DIAGNOSIS — K648 Other hemorrhoids: Secondary | ICD-10-CM

## 2023-09-02 DIAGNOSIS — D123 Benign neoplasm of transverse colon: Secondary | ICD-10-CM

## 2023-09-02 DIAGNOSIS — Z1211 Encounter for screening for malignant neoplasm of colon: Secondary | ICD-10-CM | POA: Diagnosis not present

## 2023-09-02 DIAGNOSIS — D120 Benign neoplasm of cecum: Secondary | ICD-10-CM | POA: Diagnosis not present

## 2023-09-02 DIAGNOSIS — K644 Residual hemorrhoidal skin tags: Secondary | ICD-10-CM

## 2023-09-02 DIAGNOSIS — E039 Hypothyroidism, unspecified: Secondary | ICD-10-CM | POA: Diagnosis not present

## 2023-09-02 MED ORDER — SODIUM CHLORIDE 0.9 % IV SOLN
500.0000 mL | INTRAVENOUS | Status: DC
Start: 2023-09-02 — End: 2023-09-02

## 2023-09-02 NOTE — Op Note (Signed)
 [facility] Patient Name: Madison Yang Procedure Date: 09/02/2023 9:22 AM MRN: [id number] Endoscopist: Napoleon Form , MD, [id number] Age: 73 Referring MD:  Date of Birth: [birth year] Gender: Female Account #: [id number] Procedure:                Colonoscopy Indications:              Screening for colorectal malignant neoplasm Medicines:                Monitored Anesthesia Care Procedure:                Pre-Anesthesia Assessment:                           - Prior to the procedure, a History and Physical                            was performed, and patient medications and                            allergies were reviewed. The patient's tolerance of                            previous anesthesia was also reviewed. The risks                            and benefits of the procedure and the sedation                            options and risks were discussed with the patient.                            All questions were answered, and informed consent                            was obtained. Prior Anticoagulants: The patient has                            taken no anticoagulant or antiplatelet agents. ASA                            Grade Assessment: II - A patient with mild systemic                            disease. After reviewing the risks and benefits,                            the patient was deemed in satisfactory condition to                            undergo the procedure.                           After obtaining informed consent, the colonoscope  was passed under direct vision. Throughout the                            procedure, the patient's blood pressure, pulse, and                            oxygen saturations were monitored continuously. The                            Olympus Scope [id number] was introduced through the                            anus and advanced to the the cecum, identified by                             appendiceal orifice and ileocecal valve. The                            colonoscopy was performed without difficulty. The                            patient tolerated the procedure well. The quality                            of the bowel preparation was good. The ileocecal                            valve, appendiceal orifice, and rectum were                            photographed. Scope In: 9:30:44 AM Scope Out: 9:48:46 AM Scope Withdrawal Time: 0 hours 11 minutes 36 seconds  Total Procedure Duration: 0 hours 18 minutes 2 seconds  Findings:                 The perianal and digital rectal examinations were                            normal.                           Two sessile polyps were found in the transverse                            colon and cecum. The polyps were 5 to 12 mm in                            size. These polyps were removed with a cold snare.                            Resection and retrieval were complete.                           Non-bleeding external and internal hemorrhoids were  found during retroflexion. The hemorrhoids were                            medium-sized. Complications:            No immediate complications. Estimated Blood Loss:     Estimated blood loss was minimal. Impression:               - Two 5 to 12 mm polyps in the transverse colon and                            in the cecum, removed with a cold snare. Resected                            and retrieved.                           - Non-bleeding external and internal hemorrhoids. Recommendation:           - Patient has a contact number available for                            emergencies. The signs and symptoms of potential                            delayed complications were discussed with the                            patient. Return to normal activities tomorrow.                            Written discharge instructions were provided to the                             patient.                           - Resume previous diet.                           - Continue present medications.                           - Await pathology results.                           - Repeat colonoscopy in 3 years for surveillance. Napoleon Form, MD 09/02/2023 10:02:46 AM This report has been signed electronically.

## 2023-09-02 NOTE — Progress Notes (Signed)
 [facility] History and Physical   Primary Care Physician:  Farris Has, MD   Reason for Procedure:  Colorectal cancer screening  Plan:    Screening colonoscopy with possible interventions as needed     HPI: Madison Yang is a very pleasant 73 y.o. female here for screening colonoscopy. Denies any nausea, vomiting, abdominal pain, melena or bright red blood per rectum  The risks and benefits as well as alternatives of endoscopic procedure(s) have been discussed and reviewed. All questions answered. The patient agrees to proceed.    Past Medical History:  Diagnosis Date   Allergy    Arthritis    Cervical DDD C4, C7; L foot   Melanocytic nevus 07/07/2016   R lwoer extremity   Osteopenia 07/07/2009   Psoriasis    Squamous cell carcinoma 07/07/2016   Thyroid disease 07/07/2002   Hypothyroidism    Past Surgical History:  Procedure Laterality Date   COLONOSCOPY  09/04/2012   normal; [place].  Repeat in 10 years.   LUNG LOBECTOMY  2002   Upper right lobe hamartoma   ovarian cyst removed Left 1977   ovarian scar tissue removal  1987   SKIN SURGERY     done Jan 2018 on the forehead    TONSILLECTOMY  1959    Prior to Admission medications   Medication Sig Start Date End Date Taking? Authorizing Provider  ascorbic acid (VITAMIN C) 500 MG tablet Take 500 mg by mouth daily.   Yes [provider]  b complex vitamins tablet Take 1 tablet by mouth daily.   Yes [provider]  Cholecalciferol (VITAMIN D3) 2000 UNITS TABS Take by mouth daily.   Yes [provider]  clobetasol (TEMOVATE) 0.05 % external solution Apply 1 Application topically as needed. 10/29/15  Yes [provider]  levothyroxine (SYNTHROID, LEVOTHROID) 50 MCG tablet Take 75 mcg by mouth daily.    Yes [provider]  Magnesium 250 MG TABS Take 1 tablet by mouth daily at 6 (six) AM.   Yes [provider]  Milk Thistle 250 MG CAPS Take 1 capsule by  mouth daily at 6 (six) AM.   Yes [provider]  Misc Natural Products (SUPER GREENS PO) Take 1 tablet by mouth daily at 6 (six) AM.   Yes [provider]  Nutritional Supplements (OSAPLEX MK-7 PO) Take 90 mcg by mouth daily at 6 (six) AM.   Yes [provider]  PEG 3350-KCl-NaCl-NaSulf-MgSul (SUFLAVE) 178.7 g SOLR Take 1 kit by mouth as directed. 08/03/23  Yes Margie Urbanowicz, Eleonore Chiquito, MD  Stevia, Stevia Rebaudiana, (STEVIA PO) Take by mouth as needed.   Yes [provider]  Zinc 30 MG CAPS Take 30 mg by mouth daily.   Yes [provider]  clobetasol (OLUX) 0.05 % topical foam APPLY TO AFFECTED AREA UP TO 2 TIMES DAILY AS NEEDED Patient not taking: Reported on 09/02/2023 10/02/15   [provider]  triamcinolone cream (KENALOG) 0.1 % Apply 1 Application topically as needed. Patient not taking: Reported on 09/02/2023 08/21/22   [provider]  valACYclovir (VALTREX) 1000 MG tablet Take 2,000 mg by mouth 2 (two) times daily. Patient not taking: Reported on 09/02/2023 07/21/23   [provider]  Wheat Dextrin (BENEFIBER DRINK MIX PO) Take by mouth.    [provider]    Current Outpatient Medications  Medication Sig Dispense Refill   ascorbic acid (VITAMIN C) 500 MG tablet Take 500 mg by mouth daily.  b complex vitamins tablet Take 1 tablet by mouth daily.     Cholecalciferol (VITAMIN D3) 2000 UNITS TABS Take by mouth daily.     clobetasol (TEMOVATE) 0.05 % external solution Apply 1 Application topically as needed.  0   levothyroxine (SYNTHROID, LEVOTHROID) 50 MCG tablet Take 75 mcg by mouth daily.      Magnesium 250 MG TABS Take 1 tablet by mouth daily at 6 (six) AM.     Milk Thistle 250 MG CAPS Take 1 capsule by mouth daily at 6 (six) AM.     Misc Natural Products (SUPER GREENS PO) Take 1 tablet by mouth daily at 6 (six) AM.     Nutritional Supplements (OSAPLEX MK-7 PO) Take 90 mcg by mouth daily at 6 (six) AM.      PEG 3350-KCl-NaCl-NaSulf-MgSul (SUFLAVE) 178.7 g SOLR Take 1 kit by mouth as directed. 1 each 0   Stevia, Stevia Rebaudiana, (STEVIA PO) Take by mouth as needed.     Zinc 30 MG CAPS Take 30 mg by mouth daily.     clobetasol (OLUX) 0.05 % topical foam APPLY TO AFFECTED AREA UP TO 2 TIMES DAILY AS NEEDED (Patient not taking: Reported on 09/02/2023)  0   triamcinolone cream (KENALOG) 0.1 % Apply 1 Application topically as needed. (Patient not taking: Reported on 09/02/2023)     valACYclovir (VALTREX) 1000 MG tablet Take 2,000 mg by mouth 2 (two) times daily. (Patient not taking: Reported on 09/02/2023)     Wheat Dextrin (BENEFIBER DRINK MIX PO) Take by mouth.     Current Facility-Administered Medications  Medication Dose Route Frequency Provider Last Rate Last Admin   0.9 %  sodium chloride infusion  500 mL Intravenous Continuous Abbigail Anstey, Eleonore Chiquito, MD        Allergies as of 09/02/2023 - Review Complete 09/02/2023  Allergen Reaction Noted   Penicillins Other (See Comments) 02/13/2005    Family History  Problem Relation Age of Onset   Hypertension Mother    Hyperlipidemia Mother    Dementia Mother    Hyperlipidemia Father    Hypertension Father    Cancer Father 32       melanoma; squamous cell carcinoma   Cancer Maternal Grandmother    Cancer Maternal Grandfather    Cancer Paternal Grandmother        ovarian   Colon cancer Neg Hx    Rectal cancer Neg Hx    Stomach cancer Neg Hx    Esophageal cancer Neg Hx     Social History   Socioeconomic History   Marital status: Married    Spouse name: Not on file   Number of children: 3   Years of education: College   Highest education level: Not on file  Occupational History   Occupation: Retired  Tobacco Use   Smoking status: Never   Smokeless tobacco: Never  Substance and Sexual Activity   Alcohol use: Yes    Alcohol/week: 14.0 standard drinks of alcohol    Types: 14 Glasses of wine per week    Comment: wine 1-2/ 5x   Drug use:  No   Sexual activity: Yes    Birth control/protection: None  Other Topics Concern   Not on file  Social History Narrative   Marital status: married x 25 years; happily married; no abuse; widowed 1988.      Children: stepchildren 3; 5 grandchildren; 1 great grandchildren.      Lives: with husband.      Employment:  Unemployed.  Homemaker.      Tobacco: none       Alcohol:  1-2 glasses of wine five days per week.       Drugs: none      Exercise:  Walking 2-3 days per week.  Body sculpting.        Seatbelt:  100%      Guns:  Asks for response not to be recorded in chart.   Social Drivers of Corporate investment banker Strain: Not on file  Food Insecurity: Not on file  Transportation Needs: Not on file  Physical Activity: Not on file  Stress: Not on file  Social Connections: Not on file  Intimate Partner Violence: Not on file    Review of Systems:  All other review of systems negative except as mentioned in the HPI.  Physical Exam: Vital signs in last 24 hours: BP 131/80   Pulse 68   Temp 97.8 F (36.6 C) (Skin)   Ht 5\' 2"  (1.575 m)   Wt 117 lb (53.1 kg)   LMP 03/04/2004   SpO2 99%   BMI 21.40 kg/m  General:   Alert, NAD Lungs:  Clear .   Heart:  Regular rate and rhythm Abdomen:  Soft, nontender and nondistended. Neuro/Psych:  Alert and cooperative. Normal mood and affect. A and O x 3  Reviewed labs, radiology imaging, old records and pertinent past GI work up  Patient is appropriate for planned procedure(s) and anesthesia in an ambulatory setting   K. Scherry Ran , MD [phone]

## 2023-09-02 NOTE — Progress Notes (Signed)
 Patient states there have been no changes to medical or surgical history since time of pre-visit.

## 2023-09-02 NOTE — Patient Instructions (Addendum)
 Resume previous diet.                           - Continue present medications.                           - Await pathology results.                           - Repeat colonoscopy in 3 years for surveillance. Handout on polyps given.     YOU HAD AN ENDOSCOPIC PROCEDURE TODAY AT THE [facility]:   Refer to the procedure report that was given to you for any specific questions about what was found during the examination.  If the procedure report does not answer your questions, please call your gastroenterologist to clarify.  If you requested that your care partner not be given the details of your procedure findings, then the procedure report has been included in a sealed envelope for you to review at your convenience later.  YOU SHOULD EXPECT: Some feelings of bloating in the abdomen. Passage of more gas than usual.  Walking can help get rid of the air that was put into your GI tract during the procedure and reduce the bloating. If you had a lower endoscopy (such as a colonoscopy or flexible sigmoidoscopy) you may notice spotting of blood in your stool or on the toilet paper. If you underwent a bowel prep for your procedure, you may not have a normal bowel movement for a few days.  Please Note:  You might notice some irritation and congestion in your nose or some drainage.  This is from the oxygen used during your procedure.  There is no need for concern and it should clear up in a day or so.  SYMPTOMS TO REPORT IMMEDIATELY:  Following lower endoscopy (colonoscopy or flexible sigmoidoscopy):  Excessive amounts of blood in the stool  Significant tenderness or worsening of abdominal pains  Swelling of the abdomen that is new, acute  Fever of 100F or higher   For urgent or emergent issues, a gastroenterologist can be reached at any hour by calling [phone]. Do not use MyChart messaging for urgent concerns.    DIET:  We do recommend a small meal at first, but then you may  proceed to your regular diet.  Drink plenty of fluids but you should avoid alcoholic beverages for 24 hours.  ACTIVITY:  You should plan to take it easy for the rest of today and you should NOT DRIVE or use heavy machinery until tomorrow (because of the sedation medicines used during the test).    FOLLOW UP: Our staff will call the number listed on your records the next business day following your procedure.  We will call around 7:15- 8:00 am to check on you and address any questions or concerns that you may have regarding the information given to you following your procedure. If we do not reach you, we will leave a message.     If any biopsies were taken you will be contacted by phone or by letter within the next 1-3 weeks.  Please call us at [phone] if you have not heard about the biopsies in 3 weeks.    SIGNATURES/CONFIDENTIALITY: You and/or your care partner have signed paperwork which will be entered into your electronic medical record.  These signatures attest to the fact that  that the information above on your After Visit Summary has been reviewed and is understood.  Full responsibility of the confidentiality of this discharge information lies with you and/or your care-partner.

## 2023-09-02 NOTE — Progress Notes (Signed)
 Report given to PACU, vss

## 2023-09-02 NOTE — Progress Notes (Signed)
 Called to room to assist during endoscopic procedure.  Patient ID and intended procedure confirmed with present staff. Received instructions for my participation in the procedure from the performing physician.

## 2023-09-03 ENCOUNTER — Telehealth: Payer: Self-pay

## 2023-09-03 NOTE — Telephone Encounter (Signed)
 LMOM

## 2023-09-04 DIAGNOSIS — Z1231 Encounter for screening mammogram for malignant neoplasm of breast: Secondary | ICD-10-CM | POA: Diagnosis not present

## 2023-09-04 LAB — SURGICAL PATHOLOGY

## 2023-09-14 DIAGNOSIS — D229 Melanocytic nevi, unspecified: Secondary | ICD-10-CM | POA: Diagnosis not present

## 2023-09-14 DIAGNOSIS — L821 Other seborrheic keratosis: Secondary | ICD-10-CM | POA: Diagnosis not present

## 2023-09-14 DIAGNOSIS — L400 Psoriasis vulgaris: Secondary | ICD-10-CM | POA: Diagnosis not present

## 2023-09-14 DIAGNOSIS — Z79899 Other long term (current) drug therapy: Secondary | ICD-10-CM | POA: Diagnosis not present

## 2023-09-14 DIAGNOSIS — L814 Other melanin hyperpigmentation: Secondary | ICD-10-CM | POA: Diagnosis not present

## 2023-09-14 DIAGNOSIS — D485 Neoplasm of uncertain behavior of skin: Secondary | ICD-10-CM | POA: Diagnosis not present

## 2023-09-14 DIAGNOSIS — L570 Actinic keratosis: Secondary | ICD-10-CM | POA: Diagnosis not present

## 2023-10-06 DIAGNOSIS — Z01419 Encounter for gynecological examination (general) (routine) without abnormal findings: Secondary | ICD-10-CM | POA: Diagnosis not present

## 2023-11-03 DIAGNOSIS — M810 Age-related osteoporosis without current pathological fracture: Secondary | ICD-10-CM | POA: Diagnosis not present

## 2023-11-12 ENCOUNTER — Encounter: Payer: Self-pay | Admitting: Gastroenterology

## 2023-12-09 DIAGNOSIS — M8580 Other specified disorders of bone density and structure, unspecified site: Secondary | ICD-10-CM | POA: Diagnosis not present

## 2023-12-30 DIAGNOSIS — H5203 Hypermetropia, bilateral: Secondary | ICD-10-CM | POA: Diagnosis not present

## 2023-12-30 DIAGNOSIS — H2513 Age-related nuclear cataract, bilateral: Secondary | ICD-10-CM | POA: Diagnosis not present

## 2024-01-12 DIAGNOSIS — W5501XA Bitten by cat, initial encounter: Secondary | ICD-10-CM | POA: Diagnosis not present

## 2024-01-12 DIAGNOSIS — Z23 Encounter for immunization: Secondary | ICD-10-CM | POA: Diagnosis not present

## 2024-01-12 DIAGNOSIS — S51801A Unspecified open wound of right forearm, initial encounter: Secondary | ICD-10-CM | POA: Diagnosis not present

## 2024-02-18 DIAGNOSIS — W5503XA Scratched by cat, initial encounter: Secondary | ICD-10-CM | POA: Diagnosis not present

## 2024-02-18 DIAGNOSIS — S50811A Abrasion of right forearm, initial encounter: Secondary | ICD-10-CM | POA: Diagnosis not present

## 2024-03-14 DIAGNOSIS — D239 Other benign neoplasm of skin, unspecified: Secondary | ICD-10-CM | POA: Diagnosis not present

## 2024-03-14 DIAGNOSIS — L820 Inflamed seborrheic keratosis: Secondary | ICD-10-CM | POA: Diagnosis not present

## 2024-03-14 DIAGNOSIS — D485 Neoplasm of uncertain behavior of skin: Secondary | ICD-10-CM | POA: Diagnosis not present

## 2024-03-14 DIAGNOSIS — L400 Psoriasis vulgaris: Secondary | ICD-10-CM | POA: Diagnosis not present

## 2024-03-14 DIAGNOSIS — L810 Postinflammatory hyperpigmentation: Secondary | ICD-10-CM | POA: Diagnosis not present

## 2024-03-14 DIAGNOSIS — D229 Melanocytic nevi, unspecified: Secondary | ICD-10-CM | POA: Diagnosis not present

## 2024-04-20 DIAGNOSIS — Z23 Encounter for immunization: Secondary | ICD-10-CM | POA: Diagnosis not present
